# Patient Record
Sex: Male | Born: 1995 | Race: Black or African American | Hispanic: No | Marital: Single | Smoking: Never smoker
Health system: Southern US, Community
[De-identification: ages and names within clinical notes are randomized; demographics above are authoritative.]

## PROBLEM LIST (undated history)

## (undated) DIAGNOSIS — F419 Anxiety disorder, unspecified: Secondary | ICD-10-CM

---

## 2017-07-02 ENCOUNTER — Ambulatory Visit (HOSPITAL_COMMUNITY)
Admission: EM | Admit: 2017-07-02 | Discharge: 2017-07-02 | Disposition: A | Discharge status: Home / Self Care | Payer: Managed Care, Other (non HMO) | Attending: Internal Medicine | Admitting: Internal Medicine

## 2017-07-02 ENCOUNTER — Encounter (HOSPITAL_COMMUNITY): Payer: Self-pay | Admitting: Emergency Medicine

## 2017-07-02 ENCOUNTER — Other Ambulatory Visit: Payer: Self-pay

## 2017-07-02 DIAGNOSIS — B349 Viral infection, unspecified: Secondary | ICD-10-CM | POA: Diagnosis not present

## 2017-07-02 MED ORDER — FLUTICASONE PROPIONATE 50 MCG/ACT NA SUSP: 2.0000 | Freq: Every day | NASAL | 0 refills | Status: DC

## 2017-07-02 MED ORDER — IPRATROPIUM BROMIDE 0.06 % NA SOLN: 2.0000 | Freq: Four times a day (QID) | NASAL | 0 refills | Status: DC

## 2017-07-02 MED ORDER — BENZONATATE 100 MG PO CAPS: 100.0000 mg | ORAL_CAPSULE | Freq: Three times a day (TID) | ORAL | 0 refills | Status: DC

## 2017-07-02 NOTE — ED Triage Notes (Signed)
Pt c/o flu like symptmos since Friday, fever, chills, body aches, sore throat and headache since Friday.

## 2017-07-02 NOTE — ED Provider Notes (Signed)
[facility]    CSN: [id number] Arrival date & time: 07/02/17  1912     History   Chief Complaint Chief Complaint  Patient presents with  . Headache    HPI Johnny Vargas is a 22 y.o. male.   22 year old male comes in for 4-day history of URI symptoms. Dizziness, chills, body aches, sore throat, headache, nonproductive cough, rhinorrhea, nasal congestion. Subjective fever. otc cold medications without relief.  Denies syncope, confusion.  Denies sick contact.  Never smoker.      History reviewed. No pertinent past medical history.  There are no active problems to display for this patient.   History reviewed. No pertinent surgical history.     Home Medications    Prior to Admission medications   Medication Sig Start Date End Date Taking? Authorizing Provider  benzonatate (TESSALON) 100 MG capsule Take 1 capsule (100 mg total) by mouth every 8 (eight) hours. 07/02/17   Cathie HoopsYu, Rayvin Abid V, PA-C  fluticasone (FLONASE) 50 MCG/ACT nasal spray Place 2 sprays into both nostrils daily. 07/02/17   Cathie HoopsYu, Elvy Mclarty V, PA-C  ipratropium (ATROVENT) 0.06 % nasal spray Place 2 sprays into both nostrils 4 (four) times daily. 07/02/17   Belinda FisherYu, Bernadette Armijo V, PA-C    Family History No family history on file.  Social History Social History   Tobacco Use  . Smoking status: Not on file  Substance Use Topics  . Alcohol use: Not on file  . Drug use: Not on file     Allergies   Penicillins   Review of Systems Review of Systems  Reason unable to perform ROS: See HPI as above.     Physical Exam Triage Vital Signs ED Triage Vitals  Enc Vitals Group     BP 07/02/17 1928 134/79     Pulse Rate 07/02/17 1928 75     Resp 07/02/17 1928 16     Temp 07/02/17 1928 99.4 F (37.4 C)     Temp src --      SpO2 07/02/17 1928 100 %     Weight --      Height --      Head Circumference --      Peak Flow --      Pain Score 07/02/17 1929 5     Pain Loc --      Pain Edu? --      Excl. in GC? --     No data found.  Updated Vital Signs BP 134/79   Pulse 75   Temp 99.4 F (37.4 C)   Resp 16   SpO2 100%   Physical Exam  Constitutional: He is oriented to person, place, and time. He appears well-developed and well-nourished. No distress.  HENT:  Head: Normocephalic and atraumatic.  Right Ear: External ear and ear canal normal. Tympanic membrane is not erythematous and not bulging. A middle ear effusion is present.  Left Ear: External ear and ear canal normal. Tympanic membrane is not erythematous and not bulging. A middle ear effusion is present.  Nose: Mucosal edema and rhinorrhea present. Right sinus exhibits frontal sinus tenderness. Right sinus exhibits no maxillary sinus tenderness. Left sinus exhibits frontal sinus tenderness. Left sinus exhibits no maxillary sinus tenderness.  Mouth/Throat: Uvula is midline, oropharynx is clear and moist and mucous membranes are normal.  Eyes: Conjunctivae are normal. Pupils are equal, round, and reactive to light.  Neck: Normal range of motion. Neck supple.  Cardiovascular: Normal rate, regular rhythm and normal heart sounds. Exam reveals  no gallop and no friction rub.  No murmur heard. Pulmonary/Chest: Effort normal and breath sounds normal. He has no decreased breath sounds. He has no wheezes. He has no rhonchi. He has no rales.  Lymphadenopathy:    He has no cervical adenopathy.  Neurological: He is alert and oriented to person, place, and time.  Skin: Skin is warm and dry.  Psychiatric: He has a normal mood and affect. His behavior is normal. Judgment normal.     UC Treatments / Results  Labs (all labs ordered are listed, but only abnormal results are displayed) Labs Reviewed - No data to display  EKG  EKG Interpretation None       Radiology No results found.  Procedures Procedures (including critical care time)  Medications Ordered in UC Medications - No data to display   Initial Impression / Assessment and Plan /  UC Course  I have reviewed the triage vital signs and the nursing notes.  Pertinent labs & imaging results that were available during my care of the patient were reviewed by me and considered in my medical decision making (see chart for details).    Discussed with patient history and exam most consistent with viral URI.  Patient nontoxic in appearance.  Symptomatic treatment as needed. Push fluids. Return precautions given.    Final Clinical Impressions(s) / UC Diagnoses   Final diagnoses:  Viral illness    ED Discharge Orders        Ordered    fluticasone (FLONASE) 50 MCG/ACT nasal spray  Daily     07/02/17 2015    ipratropium (ATROVENT) 0.06 % nasal spray  4 times daily     07/02/17 2015    benzonatate (TESSALON) 100 MG capsule  Every 8 hours     07/02/17 2015        Belinda Fisher, PA-C 07/02/17 2024

## 2017-07-02 NOTE — Discharge Instructions (Signed)
Tessalon for cough. Start flonase, atrovent nasal spray, for nasal congestion/drainage. You can use over the counter nasal saline rinse such as neti pot for nasal congestion. Keep hydrated, your urine should be clear to pale yellow in color. Tylenol/motrin for fever and pain. Monitor for any worsening of symptoms, chest pain, shortness of breath, wheezing, swelling of the throat, follow up for reevaluation.   For sore throat try using a honey-based tea. Use 3 teaspoons of honey with juice squeezed from half lemon. Place shaved pieces of ginger into 1/2-1 cup of water and warm over stove top. Then mix the ingredients and repeat every 4 hours as needed.

## 2018-02-27 ENCOUNTER — Emergency Department (HOSPITAL_COMMUNITY): Payer: Managed Care, Other (non HMO)

## 2018-02-27 ENCOUNTER — Emergency Department (HOSPITAL_COMMUNITY)
Admission: EM | Admit: 2018-02-27 | Discharge: 2018-02-28 | Disposition: A | Discharge status: Other Acute Inpatient Hospital | Payer: Managed Care, Other (non HMO) | Attending: Emergency Medicine | Admitting: Emergency Medicine

## 2018-02-27 ENCOUNTER — Other Ambulatory Visit: Payer: Self-pay

## 2018-02-27 DIAGNOSIS — D72819 Decreased white blood cell count, unspecified: Secondary | ICD-10-CM

## 2018-02-27 DIAGNOSIS — F329 Major depressive disorder, single episode, unspecified: Secondary | ICD-10-CM | POA: Diagnosis not present

## 2018-02-27 DIAGNOSIS — R569 Unspecified convulsions: Secondary | ICD-10-CM | POA: Insufficient documentation

## 2018-02-27 DIAGNOSIS — T450X2A Poisoning by antiallergic and antiemetic drugs, intentional self-harm, initial encounter: Secondary | ICD-10-CM

## 2018-02-27 DIAGNOSIS — R45851 Suicidal ideations: Secondary | ICD-10-CM

## 2018-02-27 DIAGNOSIS — F322 Major depressive disorder, single episode, severe without psychotic features: Secondary | ICD-10-CM

## 2018-02-27 DIAGNOSIS — Z79899 Other long term (current) drug therapy: Secondary | ICD-10-CM | POA: Diagnosis not present

## 2018-02-27 NOTE — ED Provider Notes (Addendum)
[facility] EMERGENCY DEPARTMENT Provider Note   CSN: [id number] Arrival date & time: 02/27/18  2336     History   Chief Complaint Chief Complaint  Patient presents with  . Drug Overdose  . Suicide Attempt    HPI Johnny Vargas is a 22 y.o. male.  The history is provided by the EMS personnel. The history is limited by the condition of the patient (Unresponsive).  He has history of depression and is brought in by ambulance after apparent suicide attempts.  He did leave a suicide note and took a bottle of approximately 20-30 diphenhydramine 25 mg capsules as well as a bottle of melatonin.  EMS reports 2 seizures in route.  He received 4 mg of Vidaza Lamb and had significant respiratory depression requiring ventilatory support with Ambu bag.  He did have urinary incontinence during the second seizure.  In addition to the medication, he also had a belt around his neck and apparently had tried to hang himself but there was no report of any obvious neck injury by EMS.  No past medical history on file.  There are no active problems to display for this patient.   No past surgical history on file.      Home Medications    Prior to Admission medications   Medication Sig Start Date End Date Taking? Authorizing Provider  benzonatate (TESSALON) 100 MG capsule Take 1 capsule (100 mg total) by mouth every 8 (eight) hours. 07/02/17   Cathie Hoops, Amy V, PA-C  fluticasone (FLONASE) 50 MCG/ACT nasal spray Place 2 sprays into both nostrils daily. 07/02/17   Cathie Hoops, Amy V, PA-C  ipratropium (ATROVENT) 0.06 % nasal spray Place 2 sprays into both nostrils 4 (four) times daily. 07/02/17   Belinda Fisher, PA-C    Family History No family history on file.  Social History Social History   Tobacco Use  . Smoking status: Not on file  Substance Use Topics  . Alcohol use: Not on file  . Drug use: Not on file     Allergies   Penicillins   Review of Systems Review of Systems  Unable to  perform ROS: Patient unresponsive     Physical Exam Updated Vital Signs BP (!) 123/91   Pulse 72   Temp 97.6 F (36.4 C)   Resp 16   SpO2 100%   Physical Exam  Nursing note and vitals reviewed.  22 year old male, resting comfortably and in no acute distress. Vital signs are significant for borderline elevated diastolic blood pressure. Oxygen saturation is 100%, which is normal. Head is normocephalic and atraumatic. PERRLA. Oropharynx is clear. Neck shows no signs of trauma.  There is no adenopathy or JVD. Lungs are clear without rales, wheezes, or rhonchi.  There is good air movement. Chest moves symmetrically. Heart has regular rate and rhythm without murmur. Abdomen is soft, flat without masses or hepatosplenomegaly and peristalsis is normoactive. Extremities have no cyanosis or edema, full range of motion is present. Skin is warm and dry without rash. Neurologic: He responds to painful stimuli with some purposeful movement, but is nonverbal, cranial nerves are intact, there are no gross motor or sensory deficits.  ED Treatments / Results  Labs (all labs ordered are listed, but only abnormal results are displayed) Labs Reviewed  CBC WITH DIFFERENTIAL/PLATELET - Abnormal; Notable for the following components:      Result Value   WBC 2.8 (*)    Neutro Abs 1.5 (*)    All other  components within normal limits  RAPID URINE DRUG SCREEN, HOSP PERFORMED - Abnormal; Notable for the following components:   Benzodiazepines POSITIVE (*)    All other components within normal limits  ACETAMINOPHEN LEVEL - Abnormal; Notable for the following components:   Acetaminophen (Tylenol), Serum <10 (*)    All other components within normal limits  COMPREHENSIVE METABOLIC PANEL  ETHANOL  SALICYLATE LEVEL    EKG EKG Interpretation  Date/Time:  Tuesday February 27 2018 23:48:07 EDT Ventricular Rate:  77 PR Interval:    QRS Duration: 80 QT Interval:  376 QTC Calculation: 426 R  Axis:   82 Text Interpretation:  Sinus rhythm RSR' in V1 or V2, probably normal variant ST elev, probable normal early repol pattern No old tracing to compare Confirmed by Dione Booze ([id number]) on 02/27/2018 11:54:55 PM   Radiology Ct Head Wo Contrast  Result Date: 02/28/2018 CLINICAL DATA:  Seizure EXAM: CT HEAD WITHOUT CONTRAST TECHNIQUE: Contiguous axial images were obtained from the base of the skull through the vertex without intravenous contrast. COMPARISON:  None. FINDINGS: Brain: No evidence of acute infarction, hemorrhage, hydrocephalus, extra-axial collection or mass lesion/mass effect. Vascular: No hyperdense vessel or unexpected calcification. Skull: Normal. Negative for fracture or focal lesion. Sinuses/Orbits: No acute finding. Other: None IMPRESSION: Negative non contrasted CT appearance of the brain Electronically Signed   By: Jasmine Pang M.D.   On: 02/28/2018 01:02   Dg Chest Port 1 View  Result Date: 02/28/2018 CLINICAL DATA:  Altered mental status. Attempted hanging. Medication ingestion. Witnessed seizures. EXAM: PORTABLE CHEST 1 VIEW COMPARISON:  None. FINDINGS: The heart size and mediastinal contours are within normal limits. Both lungs are clear. The visualized skeletal structures are unremarkable. IMPRESSION: No active disease. Electronically Signed   By: Burman Nieves M.D.   On: 02/28/2018 00:18    Procedures Procedures  CRITICAL CARE Performed by: Dione Booze Total critical care time: 60 minutes Critical care time was exclusive of separately billable procedures and treating other patients. Critical care was necessary to treat or prevent imminent or life-threatening deterioration. Critical care was time spent personally by me on the following activities: development of treatment plan with patient and/or surrogate as well as nursing, discussions with consultants, evaluation of patient's response to treatment, examination of patient, obtaining history from patient or  surrogate, ordering and performing treatments and interventions, ordering and review of laboratory studies, ordering and review of radiographic studies, pulse oximetry and re-evaluation of patient's condition.  Medications Ordered in ED Medications  alum & mag hydroxide-simeth (MAALOX/MYLANTA) 200-200-20 MG/5ML suspension 30 mL (has no administration in time range)  ondansetron (ZOFRAN) tablet 4 mg (has no administration in time range)  acetaminophen (TYLENOL) tablet 650 mg (has no administration in time range)  fluticasone (FLONASE) 50 MCG/ACT nasal spray 2 spray (has no administration in time range)  ipratropium (ATROVENT) 0.06 % nasal spray 2 spray (has no administration in time range)     Initial Impression / Assessment and Plan / ED Course  I have reviewed the triage vital signs and the nursing notes.  Pertinent labs & imaging results that were available during my care of the patient were reviewed by me and considered in my medical decision making (see chart for details).  Seizure following overdose of diphenhydramine and melatonin with suicide intent.  No evidence of significant neck trauma from hanging attempt.  Altered mentation likely multifactorial including postictal state, administration of benzodiazepines, sedation from diphenhydramine.  Screening labs are obtained.  Because of seizure, will send  for CT scan of head.  Poison control is consulted and recommends supportive measures.  ECG shows no tachycardia or QRS widening.  Old records were reviewed, and he had been seen at another emergency department in 2015 for depression, but was not admitted.  1:02 AM Patient is now awake and alert.  He admits to long-standing depression and long-standing suicidal thoughts but this is his first suicide attempt.  He denies crying spells or anhedonia but has had early morning wakening.  He denies hallucinations.  He denies drug use other than marijuana.  Parents are here and confirm that he is  back to his baseline.  They do state that he had been on 2 different antidepressants when he was in middle school, but stopped taking them because of there being ineffective, and also for side effects.  3:49 AM He has been hemodynamically stable, continues to have narrow QRS complex on cardiac monitoring.  He is felt to be medically cleared for psychiatric evaluation at this point.  Screening labs were unremarkable except for mild leukopenia of uncertain cause.  Ethanol level is nondetectable as are acetaminophen and salicylate levels.  Drug screen positive for benzodiazepines which probably is the midazolam which she received in the ambulance coming to the hospital.  CT of head was unremarkable as was chest x-ray.  At this point, no indication for anticonvulsant therapy.  Final Clinical Impressions(s) / ED Diagnoses   Final diagnoses:  Intentional diphenhydramine overdose, initial encounter [facility])  Suicidal ideation  Current severe episode of major depressive disorder without psychotic features without prior episode (HCC)  Leukopenia, unspecified type    ED Discharge Orders    None       Dione Booze, MD 02/28/18 [id number]    Dione Booze, MD 02/28/18 [phone]

## 2018-02-27 NOTE — ED Triage Notes (Signed)
Pt found at home by parents with belt around his neck. No ligature marks noted at present. Pt was able to tell EMS that he took a whole bottle of benadryl and melatonin prior to attempting to hang himself. EMS witnessed two seizures enroute, given 2mg  IV versed, resp rate decreased, assisted ventilations on arrival.

## 2018-02-28 ENCOUNTER — Encounter (HOSPITAL_COMMUNITY): Payer: Self-pay | Admitting: *Deleted

## 2018-02-28 ENCOUNTER — Other Ambulatory Visit: Payer: Self-pay

## 2018-02-28 ENCOUNTER — Emergency Department (HOSPITAL_COMMUNITY): Payer: Managed Care, Other (non HMO)

## 2018-02-28 ENCOUNTER — Inpatient Hospital Stay (HOSPITAL_COMMUNITY)
Admission: AD | Admit: 2018-02-28 | Discharge: 2018-03-05 | DRG: 885 | Disposition: A | Discharge status: Home / Self Care | Payer: 59 | Source: Intra-hospital | Attending: Psychiatry | Admitting: Psychiatry

## 2018-02-28 DIAGNOSIS — T50992A Poisoning by other drugs, medicaments and biological substances, intentional self-harm, initial encounter: Secondary | ICD-10-CM | POA: Diagnosis not present

## 2018-02-28 DIAGNOSIS — G4700 Insomnia, unspecified: Secondary | ICD-10-CM | POA: Diagnosis present

## 2018-02-28 DIAGNOSIS — F17210 Nicotine dependence, cigarettes, uncomplicated: Secondary | ICD-10-CM | POA: Diagnosis not present

## 2018-02-28 DIAGNOSIS — T450X2D Poisoning by antiallergic and antiemetic drugs, intentional self-harm, subsequent encounter: Secondary | ICD-10-CM | POA: Diagnosis not present

## 2018-02-28 DIAGNOSIS — F419 Anxiety disorder, unspecified: Secondary | ICD-10-CM | POA: Diagnosis present

## 2018-02-28 DIAGNOSIS — Z818 Family history of other mental and behavioral disorders: Secondary | ICD-10-CM

## 2018-02-28 DIAGNOSIS — Z880 Allergy status to penicillin: Secondary | ICD-10-CM | POA: Diagnosis not present

## 2018-02-28 DIAGNOSIS — F332 Major depressive disorder, recurrent severe without psychotic features: Secondary | ICD-10-CM | POA: Diagnosis not present

## 2018-02-28 DIAGNOSIS — T1491XA Suicide attempt, initial encounter: Secondary | ICD-10-CM | POA: Diagnosis not present

## 2018-02-28 DIAGNOSIS — T1491XD Suicide attempt, subsequent encounter: Secondary | ICD-10-CM | POA: Diagnosis not present

## 2018-02-28 DIAGNOSIS — F17290 Nicotine dependence, other tobacco product, uncomplicated: Secondary | ICD-10-CM | POA: Diagnosis present

## 2018-02-28 DIAGNOSIS — F1290 Cannabis use, unspecified, uncomplicated: Secondary | ICD-10-CM | POA: Diagnosis present

## 2018-02-28 DIAGNOSIS — F322 Major depressive disorder, single episode, severe without psychotic features: Secondary | ICD-10-CM | POA: Diagnosis present

## 2018-02-28 DIAGNOSIS — T450X2A Poisoning by antiallergic and antiemetic drugs, intentional self-harm, initial encounter: Secondary | ICD-10-CM | POA: Diagnosis not present

## 2018-02-28 DIAGNOSIS — Z915 Personal history of self-harm: Secondary | ICD-10-CM | POA: Diagnosis not present

## 2018-02-28 DIAGNOSIS — F329 Major depressive disorder, single episode, unspecified: Secondary | ICD-10-CM | POA: Diagnosis not present

## 2018-02-28 HISTORY — DX: Anxiety disorder, unspecified: F41.9

## 2018-02-28 LAB — COMPREHENSIVE METABOLIC PANEL
ALT: 23 U/L (ref 0–44)
AST: 24 U/L (ref 15–41)
Albumin: 4.2 g/dL (ref 3.5–5.0)
Alkaline Phosphatase: 61 U/L (ref 38–126)
Anion gap: 8 (ref 5–15)
BILIRUBIN TOTAL: 0.4 mg/dL (ref 0.3–1.2)
BUN: 13 mg/dL (ref 6–20)
CO2: 22 mmol/L (ref 22–32)
Calcium: 9.6 mg/dL (ref 8.9–10.3)
Chloride: 109 mmol/L (ref 98–111)
Creatinine, Ser: 1.13 mg/dL (ref 0.61–1.24)
GFR calc Af Amer: >60 mL/min (ref >60)
Glucose, Bld: 86 mg/dL (ref 70–99)
POTASSIUM: 4.2 mmol/L (ref 3.5–5.1)
Sodium: 139 mmol/L (ref 135–145)
TOTAL PROTEIN: 7.1 g/dL (ref 6.5–8.1)

## 2018-02-28 LAB — CBC WITH DIFFERENTIAL/PLATELET
Abs Immature Granulocytes: 0.01 10*3/uL (ref 0.00–0.07)
BASOS PCT: 1 %
Basophils Absolute: 0 10*3/uL (ref 0.0–0.1)
EOS ABS: 0 10*3/uL (ref 0.0–0.5)
EOS PCT: 1 %
HCT: 50.3 % (ref 39.0–52.0)
Hemoglobin: 15.7 g/dL (ref 13.0–17.0)
Immature Granulocytes: 0 %
Lymphocytes Relative: 36 %
Lymphs Abs: 1 10*3/uL (ref 0.7–4.0)
MCH: 27.6 pg (ref 26.0–34.0)
MCHC: 31.2 g/dL (ref 30.0–36.0)
MCV: 88.4 fL (ref 80.0–100.0)
MONO ABS: 0.3 10*3/uL (ref 0.1–1.0)
MONOS PCT: 10 %
NEUTROS PCT: 52 %
Neutro Abs: 1.5 10*3/uL — ABNORMAL LOW (ref 1.7–7.7)
PLATELETS: 167 10*3/uL (ref 150–400)
RBC: 5.69 MIL/uL (ref 4.22–5.81)
RDW: 14.9 % (ref 11.5–15.5)
WBC: 2.8 10*3/uL — ABNORMAL LOW (ref 4.0–10.5)
nRBC: 0 % (ref 0.0–0.2)

## 2018-02-28 LAB — RAPID URINE DRUG SCREEN, HOSP PERFORMED
Amphetamines: NOT DETECTED
Barbiturates: NOT DETECTED
Benzodiazepines: POSITIVE — AB
Cocaine: NOT DETECTED
Opiates: NOT DETECTED
Tetrahydrocannabinol: NOT DETECTED

## 2018-02-28 LAB — SALICYLATE LEVEL

## 2018-02-28 LAB — ACETAMINOPHEN LEVEL

## 2018-02-28 LAB — ETHANOL

## 2018-02-28 MED ORDER — ALUM & MAG HYDROXIDE-SIMETH 200-200-20 MG/5ML PO SUSP: 30.0000 mL | Freq: Four times a day (QID) | ORAL | Status: DC | PRN

## 2018-02-28 MED ORDER — ACETAMINOPHEN 325 MG PO TABS: 650.0000 mg | ORAL_TABLET | ORAL | Status: DC | PRN

## 2018-02-28 MED ORDER — FLUTICASONE PROPIONATE 50 MCG/ACT NA SUSP
2.0000 | Freq: Every day | NASAL | Status: DC
  Administered 2018-02-28: 2 via NASAL
  Filled 2018-02-28: qty 16

## 2018-02-28 MED ORDER — HYDROXYZINE HCL 25 MG PO TABS
25.0000 mg | ORAL_TABLET | Freq: Three times a day (TID) | ORAL | Status: DC | PRN
  Administered 2018-03-01 – 2018-03-04 (×3): 25 mg via ORAL
  Filled 2018-02-28 (×2): qty 1

## 2018-02-28 MED ORDER — TRAZODONE HCL 50 MG PO TABS
50.0000 mg | ORAL_TABLET | Freq: Every evening | ORAL | Status: DC | PRN
  Administered 2018-02-28 – 2018-03-02 (×5): 50 mg via ORAL
  Filled 2018-02-28 (×10): qty 1

## 2018-02-28 MED ORDER — ONDANSETRON HCL 4 MG PO TABS: 4.0000 mg | ORAL_TABLET | Freq: Three times a day (TID) | ORAL | Status: DC | PRN

## 2018-02-28 MED ORDER — IPRATROPIUM BROMIDE 0.06 % NA SOLN
2.0000 | Freq: Four times a day (QID) | NASAL | Status: DC
  Filled 2018-02-28 (×2): qty 15

## 2018-02-28 MED ORDER — IPRATROPIUM BROMIDE 0.06 % NA SOLN
2.0000 | Freq: Four times a day (QID) | NASAL | Status: DC
  Administered 2018-02-28: 2 via NASAL
  Filled 2018-02-28: qty 15

## 2018-02-28 MED ORDER — FLUTICASONE PROPIONATE 50 MCG/ACT NA SUSP
2.0000 | Freq: Every day | NASAL | Status: DC
  Filled 2018-02-28: qty 16

## 2018-02-28 NOTE — ED Notes (Signed)
Called [facility] for transfer to [facility]

## 2018-02-28 NOTE — Progress Notes (Signed)
Johnny Vargas is a 22 year old male pt admitted on voluntary basis. On admission, he reports that he has been feeling depressed and suicidal and does report that he took overdose in suicide attempt. He reports school and money stress and reports that he is not doing weill in school. He reports that he has had depression since the 3rd grade but has never sought any help previously. He reports that he is not on any medications. He reports occasional marijuana usage but denies any other substance abuse issues. He reports that he lives at home with mom and step-dad and reports that he will return there once he is discharged. Khaleem was escorted to the unit, oriented to the milieu and safety maintained.

## 2018-02-28 NOTE — ED Notes (Signed)
[name], NP, patient meets inpatient criteria. TTS to secure placement. Elliot Gurney, Charity fundraiser, informed of disposition.

## 2018-02-28 NOTE — ED Notes (Signed)
PT given lunch bag and 8oz drink.

## 2018-02-28 NOTE — Tx Team (Signed)
Initial Treatment Plan 02/28/2018 3:55 PM [name] RUE:[id number]    PATIENT STRESSORS: Educational concerns Financial difficulties Occupational concerns   PATIENT STRENGTHS: Ability for insight Average or above average intelligence General fund of knowledge Motivation for treatment/growth   PATIENT IDENTIFIED PROBLEMS: Depression Suicidal thoughts "I don't know, maybe medications if it will help"                     DISCHARGE CRITERIA:  Ability to meet basic life and health needs Improved stabilization in mood, thinking, and/or behavior Reduction of life-threatening or endangering symptoms to within safe limits Verbal commitment to aftercare and medication compliance  PRELIMINARY DISCHARGE PLAN: Attend aftercare/continuing care group Return to previous living arrangement  PATIENT/FAMILY INVOLVEMENT: This treatment plan has been presented to and reviewed with the patient, Johnny Vargas, and/or family member, .  The patient and family have been given the opportunity to ask questions and make suggestions.  Johnny Vargas, [place], [place] 02/28/2018, 3:55 PM

## 2018-02-28 NOTE — BH Assessment (Addendum)
Tele Assessment Note   Patient Name: Johnny Vargas MRN: [id number] Referring Physician: Dr. Preston Fleeting Location of Patient: [facility] Bed: B15C Location of Provider: Behavioral Health TTS Department  Johnny Vargas is an 22 y.o. male with SI with attempted overdose and hanging. Patient was found by his parents and called EMS. Patient reported to EMS that he took a whole bottle of benadryl and melatonin prior to attempting to hang himself, approximately 20-30 diphenhydramine 25 mg capsules as well as a bottle of melatonin. Per ED Triage Note, EMS witnessed two seizures enroute, given 2mg  IV versed, resp rate decreased, assisted ventilations on arrival. Patient has history of depression. Patient did leave suicide note. Patient reported having SI since the 3rd grade, coping with thoughts by ignoring them. Patient reported SI worsened in this past year. Patient reported stressors, stating, "I can't handle it anymore, school, job, money, just overwhelming stuff". Patient reported being under pressure. Patient admits to smoking marijuana once only on weekends. Patient denied history of psychiatric treatment or outpatient therapy, stating one time he came to hospital but didn't stay which was more than a couple of years ago. Patient denied being prescribed any medications. Patient denied HI and psychosis. Patient reported that both sleep and appetite are poor.   Patient is currently a Consulting civil engineer at [employer] When asked how are you doing in school, patient reported, "not the best" and chose not to elaborate. Patient resides with mother and stepdad.   Patient was calm and cooperative during assessment. Patient was oriented x4 and coherent. Patient mood was depressed, despair, helpless and sad. Patient affect was depressed and sad. Patient was drowsy. Patient speech was logical however slow. Patient agreed he needed help and is willing to sign himself in for treatment.   UDS +benzodiazepines ETOH negative  Diagnosis: Major  depressive disorder  Past Medical History: No past medical history on file.  No past surgical history on file.  Family History: No family history on file.  Social History:  has no tobacco, alcohol, and drug history on file.  Additional Social History:  Alcohol / Drug Use Pain Medications: see MAR Prescriptions: see MAR Over the Counter: see MAR  CIWA: CIWA-Ar BP: 96/63 Pulse Rate: 61 COWS:    Allergies:  Allergies  Allergen Reactions  . Penicillins     Home Medications:  (Not in a hospital admission)  OB/GYN Status:  No LMP for male patient.  General Assessment Data Location of Assessment: [facility] ED TTS Assessment: In system Is this a Tele or Face-to-Face Assessment?: Tele Assessment Is this an Initial Assessment or a Re-assessment for this encounter?: Initial Assessment Patient Accompanied by:: (alone) Language Other than English: No Living Arrangements: (family home) What gender do you identify as?: Male Marital status: Single Living Arrangements: Parent Can pt return to current living arrangement?: Yes Admission Status: Voluntary Is patient capable of signing voluntary admission?: Yes Referral Source: Self/Family/Friend     Crisis Care Plan Living Arrangements: Parent Legal Guardian: (self) Name of Psychiatrist: (none) Name of Therapist: (none)  Education Status Is patient currently in school?: Yes Current Grade: (college) Name of school: ([employer])  Risk to self with the past 6 months Suicidal Ideation: Yes-Currently Present Has patient been a risk to self within the past 6 months prior to admission? : Yes Suicidal Intent: Yes-Currently Present Has patient had any suicidal intent within the past 6 months prior to admission? : Yes Is patient at risk for suicide?: Yes Suicidal Plan?: Yes-Currently Present Has patient had any suicidal plan within  the past 6 months prior to admission? : Yes Specify Current Suicidal Plan: (attempted overdose and  hanging) Access to Means: Yes Specify Access to Suicidal Means: (pills in home and belt in the house) What has been your use of drugs/alcohol within the last 12 months?: (marijuana once weekly) Previous Attempts/Gestures: No How many times?: (0) Triggers for Past Attempts: ("school, job, money, overwhelming Production designer, theatre/television/film Self Injurious Behavior: None Family Suicide History: No Recent stressful life event(s): Financial Problems("school, job, money and overwhelming stuff") Persecutory voices/beliefs?: No Depression: Yes Depression Symptoms: Insomnia, Isolating, Loss of interest in usual pleasures, Fatigue Substance abuse history and/or treatment for substance abuse?: No  Risk to Others within the past 6 months Homicidal Ideation: No Does patient have any lifetime risk of violence toward others beyond the six months prior to admission? : No Thoughts of Harm to Others: No Current Homicidal Intent: No Current Homicidal Plan: No Access to Homicidal Means: No History of harm to others?: No Assessment of Violence: None Noted Does patient have access to weapons?: No Criminal Charges Pending?: No Does patient have a court date: No Is patient on probation?: No  Psychosis Hallucinations: None noted Delusions: None noted  Mental Status Report Appearance/Hygiene: Unremarkable Eye Contact: Fair Motor Activity: Unremarkable Speech: Logical/coherent, Slow Level of Consciousness: Alert, Drowsy Mood: Depressed, Despair, Helpless, Sad Affect: Depressed, Sad Anxiety Level: Minimal Thought Processes: Coherent Judgement: Impaired Orientation: Place, Person, Situation, Time Obsessive Compulsive Thoughts/Behaviors: None  Cognitive Functioning Concentration: Fair Memory: Recent Intact Is patient IDD: No Insight: Fair Impulse Control: Poor Appetite: Poor Have you had any weight changes? : No Change Sleep: Decreased Total Hours of Sleep: (not sure) Vegetative Symptoms:  None  ADLScreening [facility] Assessment Services) Patient's cognitive ability adequate to safely complete daily activities?: Yes Patient able to express need for assistance with ADLs?: Yes Independently performs ADLs?: Yes (appropriate for developmental age)  Prior Inpatient Therapy Prior Inpatient Therapy: No  Prior Outpatient Therapy Prior Outpatient Therapy: No Does patient have an ACCT team?: No Does patient have Intensive In-House Services?  : No Does patient have [facility] services? : No Does patient have P4CC services?: No  ADL Screening (condition at time of admission) Patient's cognitive ability adequate to safely complete daily activities?: Yes Patient able to express need for assistance with ADLs?: Yes Independently performs ADLs?: Yes (appropriate for developmental age)                        Disposition:  Disposition Initial Assessment Completed for this Encounter: Yes  [name], NP, patient meets inpatient criteria. TTS to secure placement. Doctor and RN notified of disposition.  This service was provided via telemedicine using a 2-way, interactive audio and video technology.  Names of all persons participating in this telemedicine service and their role in this encounter. Name: Johnny Vargas Role: patient  Name: Al [facility], [facility] Role: TTS Clinician  Name:  Role:   Name:  Role:     Burnetta Sabin 02/28/2018 4:48 AM

## 2018-02-28 NOTE — ED Notes (Signed)
Pt given phone to make a call 

## 2018-02-28 NOTE — ED Notes (Signed)
Poison control contacted. 0441am case was closed by poison control after speaking with Elliot Gurney, Charity fundraiser.

## 2018-02-28 NOTE — ED Notes (Signed)
Belongings given to family.

## 2018-02-28 NOTE — Progress Notes (Signed)
Pt accepted to [facility], Bed 307-2 [name], NP, is the accepting provider.  Dr. Elsie Saas, MD, is the attending provider.  Call report to [phone]  [name], RN @ [facility] Psych ED notified.   Pt is Voluntary.  Pt may be transported by [facility]  Pt scheduled  to arrive at [facility] AFTER POISON CONTROL SIGNS OFF AND NOTE IS ENTERED.  Johnny Vargas. [name], MSW, LCSWA Disposition Clinical Social Work [phone] (cell) [phone] (office)

## 2018-02-28 NOTE — ED Notes (Signed)
TTS being done at this time.  

## 2018-02-28 NOTE — Progress Notes (Signed)
Patient did not attend the evening speaker NA meeting. Pt was notified that group was beginning but remained in bed.   

## 2018-03-01 DIAGNOSIS — T50992A Poisoning by other drugs, medicaments and biological substances, intentional self-harm, initial encounter: Secondary | ICD-10-CM

## 2018-03-01 DIAGNOSIS — T1491XA Suicide attempt, initial encounter: Secondary | ICD-10-CM

## 2018-03-01 DIAGNOSIS — T450X2A Poisoning by antiallergic and antiemetic drugs, intentional self-harm, initial encounter: Secondary | ICD-10-CM

## 2018-03-01 DIAGNOSIS — F332 Major depressive disorder, recurrent severe without psychotic features: Principal | ICD-10-CM

## 2018-03-01 MED ORDER — ESCITALOPRAM OXALATE 10 MG PO TABS
10.0000 mg | ORAL_TABLET | Freq: Every day | ORAL | Status: DC
  Administered 2018-03-01 – 2018-03-03 (×3): 10 mg via ORAL
  Filled 2018-03-01 (×7): qty 1

## 2018-03-01 NOTE — H&P (Addendum)
Psychiatric Admission Assessment Adult  Patient Identification: Johnny Vargas  MRN:  [id number]  Date of Evaluation:  03/01/2018  Chief Complaint: Worsening symptoms of depression triggering suicide attempt by overdose & hanging.  Principal Diagnosis: MDD (major depressive disorder), severe ([place])  Diagnosis:   Patient Active Problem List   Diagnosis Date Noted  . MDD (major depressive disorder), severe ([place]) [F32.2] 02/28/2018    Priority: High   History of Present Illness: This is an admission assessment for this 22 year old African-American male. Patient is admitted to the [facility] from the [facility] ED with complaints of worsening symptoms of depression & suicide attempt by overdose on Benadryl & Melatonin. Chart review indicates patient citing school related stress & financial problems as the trigger. He was transferred to the [facility] for more psychiatric evaluation & treatment.  During this assessment, Johnny Vargas reports, "I remembered waking up at the hospital on the 15th of this month, but I do not remember how I got there or anything else. When I woke up, I saw a cop asking me questions about Marijuana. I still remember bits & pieces of what was happening to me & how I was feeling prior to my transfer to the ED. I got wrapped up with some bad group who were into drugs & other bad things. I was trying to dissociate myself from this bad group & yet feeling alone because I do not have other friends. The lonely life kept on building up. It got to a point whereby I could no longer cope with it. I felt like no one cares about me or needs me.  I decided to take my own life. I took a bottle of Benadryl & a bottle of water. I keeping taking those pills. I also took some Melatonin to enhance the effects of the Benadryl. I then took my belt & hung myself by my closet.  After I put the belt around my neck, I lowered myself on the floor. I felt myself shutting down, then everything became dark. This is my  first attempt & it was unplanned, rather impulsive. I know I have been depressed since 3rd grade. My father died of cancer when I was 4. I have a step-father now. I have problem sleeping at night. I have never been treated for depression".  Associated Signs/Symptoms:  Depression Symptoms:  depressed mood, insomnia, feelings of worthlessness/guilt, hopelessness, anxiety,  (Hypo) Manic Symptoms:  Impulsivity,  Anxiety Symptoms:  Excessive Worry,  Psychotic Symptoms:  Denies any hallucinations, delusions or paranoia.  PTSD Symptoms: Denies any PTSD symptoms or events.  Total Time spent with patient: 1 hour  Past Psychiatric History: Depression  Is the patient at risk to self? No.  Has the patient been a risk to self in the past 6 months? Yes.    Has the patient been a risk to self within the distant past? No.  Is the patient a risk to others? No.  Has the patient been a risk to others in the past 6 months? No.  Has the patient been a risk to others within the distant past? No.   Prior Inpatient Therapy: Denies Prior Outpatient Therapy: Denies  Alcohol Screening: 1. How often do you have a drink containing alcohol?: 2 to 4 times a month 2. How many drinks containing alcohol do you have on a typical day when you are drinking?: 1 or 2 3. How often do you have six or more drinks on one occasion?: Never AUDIT-C Score: 2  4. How often during the last year have you found that you were not able to stop drinking once you had started?: Never 5. How often during the last year have you failed to do what was normally expected from you becasue of drinking?: Never 6. How often during the last year have you needed a first drink in the morning to get yourself going after a heavy drinking session?: Never 7. How often during the last year have you had a feeling of guilt of remorse after drinking?: Never 8. How often during the last year have you been unable to remember what happened the night  before because you had been drinking?: Never 9. Have you or someone else been injured as a result of your drinking?: No 10. Has a relative or friend or a doctor or another health worker been concerned about your drinking or suggested you cut down?: No Alcohol Use Disorder Identification Test Final Score (AUDIT): 2 Intervention/Follow-up: AUDIT Score <7 follow-up not indicated  Substance Abuse History in the last 12 months:  Yes.    Consequences of Substance Abuse: Medical Consequences:  Liver damage, Possible death by overdose Legal Consequences:  Arrests, jail time, Loss of driving privilege. Family Consequences:  Family discord, divorce and or separation.  Previous Psychotropic Medications: No   Psychological Evaluations: No   Past Medical History:  Past Medical History:  Diagnosis Date  . Anxiety    History reviewed. No pertinent surgical history. Family History: History reviewed. No pertinent family history.  Family Psychiatric  History: Major depression: Mother.  Tobacco Screening: Have you used any form of tobacco in the last 30 days? (Cigarettes, Smokeless Tobacco, Cigars, and/or Pipes): Yes Tobacco use, Select all that apply: 4 or less cigarettes per day Are you interested in Tobacco Cessation Medications?: No, patient refused Counseled patient on smoking cessation including recognizing danger situations, developing coping skills and basic information about quitting provided: Refused/Declined practical counseling  Social History: Single, no children, unemployed, Electronics engineer.  Social History   Substance and Sexual Activity  Alcohol Use Yes     Social History   Substance and Sexual Activity  Drug Use Yes  . Types: Marijuana    Additional Social History:  Allergies:   Allergies  Allergen Reactions  . Penicillins    Lab Results:  Results for orders placed or performed during the hospital encounter of 02/27/18 (from the past 48 hour(s))  Comprehensive  metabolic panel     Status: None   Collection Time: 02/27/18 11:58 PM  Result Value Ref Range   Sodium 139 135 - 145 mmol/L   Potassium 4.2 3.5 - 5.1 mmol/L   Chloride 109 98 - 111 mmol/L   CO2 22 22 - 32 mmol/L   Glucose, Bld 86 70 - 99 mg/dL   BUN 13 6 - 20 mg/dL   Creatinine, Ser 1.13 0.61 - 1.24 mg/dL   Calcium 9.6 8.9 - 10.3 mg/dL   Total Protein 7.1 6.5 - 8.1 g/dL   Albumin 4.2 3.5 - 5.0 g/dL   AST 24 15 - 41 U/L   ALT 23 0 - 44 U/L   Alkaline Phosphatase 61 38 - 126 U/L   Total Bilirubin 0.4 0.3 - 1.2 mg/dL   GFR calc non Af Amer >60 >60 mL/min   GFR calc Af Amer >60 >60 mL/min    Comment: (NOTE) The eGFR has been calculated using the CKD EPI equation. This calculation has not been validated in all clinical situations. eGFR's persistently <60  mL/min signify possible Chronic Kidney Disease.    Anion gap 8 5 - 15    Comment: Performed at [facility] [address] [place], [place] [address]  Ethanol     Status: None   Collection Time: 02/27/18 11:58 PM  Result Value Ref Range   Alcohol, Ethyl (B) <10 <10 mg/dL    Comment: (NOTE) Lowest detectable limit for serum alcohol is 10 mg/dL. For medical purposes only. Performed at [facility], [place] [address] [place], [place] [address]   CBC with Differential     Status: Abnormal   Collection Time: 02/27/18 11:58 PM  Result Value Ref Range   WBC 2.8 (L) 4.0 - 10.5 K/uL   RBC 5.69 4.22 - 5.81 MIL/uL   Hemoglobin 15.7 13.0 - 17.0 g/dL   HCT 50.3 39.0 - 52.0 %   MCV 88.4 80.0 - 100.0 fL   MCH 27.6 26.0 - 34.0 pg   MCHC 31.2 30.0 - 36.0 g/dL   RDW 14.9 11.5 - 15.5 %   Platelets 167 150 - 400 K/uL   nRBC 0.0 0.0 - 0.2 %   Neutrophils Relative % 52 %   Neutro Abs 1.5 (L) 1.7 - 7.7 K/uL   Lymphocytes Relative 36 %   Lymphs Abs 1.0 0.7 - 4.0 K/uL   Monocytes Relative 10 %   Monocytes Absolute 0.3 0.1 - 1.0 K/uL   Eosinophils Relative 1 %   Eosinophils Absolute 0.0 0.0 - 0.5 K/uL   Basophils Relative 1 %    Basophils Absolute 0.0 0.0 - 0.1 K/uL   Immature Granulocytes 0 %   Abs Immature Granulocytes 0.01 0.00 - 0.07 K/uL    Comment: Performed at [facility] [address], [place], [place] [address]  Salicylate level     Status: None   Collection Time: 02/27/18 11:58 PM  Result Value Ref Range   Salicylate Lvl <2.1 2.8 - 30.0 mg/dL    Comment: Performed at [facility] [address], [place], [place] [address]  Acetaminophen level     Status: Abnormal   Collection Time: 02/27/18 11:58 PM  Result Value Ref Range   Acetaminophen (Tylenol), Serum <10 (L) 10 - 30 ug/mL    Comment: (NOTE) Therapeutic concentrations vary significantly. A range of 10-30 ug/mL  may be an effective concentration for many patients. However, some  are best treated at concentrations outside of this range. Acetaminophen concentrations >150 ug/mL at 4 hours after ingestion  and >50 ug/mL at 12 hours after ingestion are often associated with  toxic reactions. Performed at [facility], [place] [address], [place],  [address]   Urine rapid drug screen (hosp performed)     Status: Abnormal   Collection Time: 02/28/18  1:56 AM  Result Value Ref Range   Opiates NONE DETECTED NONE DETECTED   Cocaine NONE DETECTED NONE DETECTED   Benzodiazepines POSITIVE (A) NONE DETECTED   Amphetamines NONE DETECTED NONE DETECTED   Tetrahydrocannabinol NONE DETECTED NONE DETECTED   Barbiturates NONE DETECTED NONE DETECTED    Comment: (NOTE) DRUG SCREEN FOR MEDICAL PURPOSES ONLY.  IF CONFIRMATION IS NEEDED FOR ANY PURPOSE, NOTIFY LAB WITHIN 5 DAYS. LOWEST DETECTABLE LIMITS FOR URINE DRUG SCREEN Drug Class                     Cutoff (ng/mL) Amphetamine and metabolites    1000 Barbiturate and metabolites    200 Benzodiazepine  154 Tricyclics and metabolites     300 Opiates and metabolites        300 Cocaine and metabolites        300 THC                            50 Performed at [facility], [place] [address], [place], [place] [address]    Blood Alcohol level:  Lab Results  Component Value Date   ETH <10 61/95/0932   Metabolic Disorder Labs:  No results found for: HGBA1C, MPG No results found for: PROLACTIN No results found for: CHOL, TRIG, HDL, CHOLHDL, VLDL, LDLCALC  Current Medications: Current Facility-Administered Medications  Medication Dose Route Frequency Provider Last Rate Last Dose  . acetaminophen (TYLENOL) tablet 650 mg  650 mg Oral Q4H PRN Rankin, Shuvon B, NP      . fluticasone (FLONASE) 50 MCG/ACT nasal spray 2 spray  2 spray Each Nare Daily Rankin, Shuvon B, NP      . hydrOXYzine (ATARAX/VISTARIL) tablet 25 mg  25 mg Oral TID PRN Lindon Romp A, NP      . ipratropium (ATROVENT) 0.06 % nasal spray 2 spray  2 spray Each Nare QID Rankin, Shuvon B, NP      . traZODone (DESYREL) tablet 50 mg  50 mg Oral QHS,MR X 1 Lindon Romp A, NP   50 mg at 02/28/18 2353   PTA Medications: Medications Prior to Admission  Medication Sig Dispense Refill Last Dose  . benzonatate (TESSALON) 100 MG capsule Take 1 capsule (100 mg total) by mouth every 8 (eight) hours. (Patient not taking: Reported on 02/28/2018) 21 capsule 0 Not Taking at Unknown time  . fluticasone (FLONASE) 50 MCG/ACT nasal spray Place 2 sprays into both nostrils daily. (Patient not taking: Reported on 02/28/2018) 1 g 0 Not Taking at Unknown time  . ipratropium (ATROVENT) 0.06 % nasal spray Place 2 sprays into both nostrils 4 (four) times daily. (Patient not taking: Reported on 02/28/2018) 15 mL 0 Not Taking at Unknown time   Musculoskeletal: Strength & Muscle Tone: within normal limits Gait & Station: normal Patient leans: N/A  Psychiatric Specialty Exam: Physical Exam  Constitutional: He is oriented to person, place, and time. He appears well-developed.  HENT:  Head: Normocephalic.  Eyes: Pupils are equal, round, and reactive to light.  Neck: Normal range of motion.  Cardiovascular: Normal  rate.  Respiratory: Effort normal.  GI: Soft.  Genitourinary:  Genitourinary Comments: Deferred  Musculoskeletal: Normal range of motion.  Neurological: He is alert and oriented to person, place, and time.  Skin: Skin is warm and dry.    Review of Systems  Constitutional: Negative.   HENT: Negative.   Eyes: Negative.   Respiratory: Negative.  Negative for cough and shortness of breath.   Cardiovascular: Negative.  Negative for chest pain and palpitations.  Gastrointestinal: Negative.   Genitourinary: Negative.   Musculoskeletal: Negative.   Skin: Negative.   Neurological: Negative.   Endo/Heme/Allergies: Negative.   Psychiatric/Behavioral: Positive for depression and substance abuse (UDS (+) for Benzodiazepine). Negative for hallucinations and memory loss. The patient is nervous/anxious and has insomnia.     Blood pressure 115/88, pulse 85, temperature 100.1 F (37.8 C), temperature source Oral, resp. rate 16, height '5\' 10"'$  (1.778 m), weight 65.3 kg.Body mass index is 20.66 kg/m.  General Appearance: Casual, in a hospital scrub.  Eye Contact:  Good  Speech:  Clear and Coherent  and Normal Rate  Volume:  Normal  Mood:  Anxious, Depressed and Hopeless  Affect:  Depressed and Flat  Thought Process:  Coherent, Goal Directed and Descriptions of Associations: Intact  Orientation:  Full (Time, Place, and Person)  Thought Content:  Logical, denies any hallucinations, delusions & paranoia.  Suicidal Thoughts:  Currently denies any thoughts, plans or intent. Able to contract for safety.  Homicidal Thoughts:  Denies  Memory:  Immediate;   Good Recent;   Good Remote;   Good  Judgement:  Other:  Fairly intact  Insight:  Present  Psychomotor Activity:  Normal  Concentration:  Concentration: Good and Attention Span: Good  Recall:  Good  Fund of Knowledge:  Fair  Language:  Good  Akathisia:  No  Handed:  Right  AIMS (if indicated):     Assets:  Communication Skills Desire for  Improvement Physical Health Social Support  ADL's:  Intact  Cognition:  WNL  Sleep:  Number of Hours: 3.75   Treatment Plan Summary: Treatment Plan/Recommendations: 1. Admit for crisis management and stabilization, estimated length of stay 3-5 days.   2. Medication management to reduce current symptoms to base line and improve the patient's overall level of functioning: See MAR, Md's SRA & treatment plan.   Observation Level/Precautions:  15 minute checks  Laboratory:  Per ED, UDS (+) for Benzodiazepine  Psychotherapy: Group sessions   Medications: See Mar   Consultations: As needed    Discharge Concerns: Safety, mood stability.    Estimated LOS: 2-4 days  Other: Admit to the 300-hall.    Physician Treatment Plan for Primary Diagnosis: MDD (major depressive disorder), severe ([place])  Long Term Goal(s): Improvement in symptoms so as ready for discharge  Short Term Goals: Ability to identify changes in lifestyle to reduce recurrence of condition will improve, Ability to verbalize feelings will improve and Ability to disclose and discuss suicidal ideas  Physician Treatment Plan for Secondary Diagnosis: Principal Problem:   MDD (major depressive disorder), severe ([place])  Long Term Goal(s): Improvement in symptoms so as ready for discharge  Short Term Goals: Ability to identify and develop effective coping behaviors will improve, Compliance with prescribed medications will improve and Ability to identify triggers associated with substance abuse/mental health issues will improve  I certify that inpatient services furnished can reasonably be expected to improve the patient's condition.    Lindell Spar, NP, PMHNP, FNP-BC. 10/17/201912:39 PM    I have reviewed NP's Note, assessement, diagnosis and plan, and agree. I have also met with patient and completed suicide risk assessment.  Rosalio Catterton is a 22 y/o M without formal psychiatric history who was admitted voluntarily after a a  suicide attempt on benadryl and melatonin. He was medically cleared and then transferred to [facility] for additional treatment and stabilization.  Upon initial interview, pt shares, "I tried to kill myself, but it didn't work." Pt describes worsening depression associated with feeling overwhelmed between multiple responsibilities at his college and job recently, and he describes SI without plan for the past several weeks. He reports that overdose was impulsive. He denies SI/HI/AH/VH. He reports poor sleep, low energy, poor concentration, anhedonia, and guilty feelings prior to admission. He denies symptoms of mania, OCD, and PTSD. He has been using cannabis about twice per week and he denies other illicit substance use.  Discussed with patient about treatment options. He is remorseful regarding his suicide attempt, and he describes having good social support outside the hospital. He is in agreement to trial  of lexapro for depression and trial of trazodone PRN to help with sleep. He was in agreement with the above plan, and he had no further questions, comments, or concerns.   PLAN OF CARE:   -admit to inpatient level of care  -MDD, recurrent, severe, without psychosis             -Start lexapro 58m po qDay  -anxiety             -Start vistaril 254mpo q8h prn anxiety  -insomnia             -Start trazodone 5091mo qhs prn insomnia  -Encourage participation in groups and therapeutic milieu  -disposition planning will be ongoing    ChrMaris BergerD

## 2018-03-01 NOTE — Plan of Care (Signed)
Patient was pleasant with a sad/depressed affect. Patient said he slept well and denied SI HI AVH. Denies physical pain, denies anxiety. Patient voiced no complaints. Patient safety is maintained with 15 minute checks as well as environmental checks. Will continue to monitor.  Problem: Education: Goal: Knowledge of  General Education information/materials will improve Outcome: Progressing   Problem: Activity: Goal: Interest or engagement in activities will improve Outcome: Progressing Goal: Sleeping patterns will improve Outcome: Progressing   Problem: Coping: Goal: Ability to demonstrate self-control will improve Outcome: Progressing   Problem: Education: Goal: Emotional status will improve Outcome: Not Progressing Goal: Mental status will improve Outcome: Not Progressing

## 2018-03-01 NOTE — Progress Notes (Signed)
Pt is requesting outpatient medication management/therapy and is currently attending school/unable to do PCP or IOP. Pt insurance not accepted by [facility]. Referral made to [facility].  Tollie Canada S. Alan Ripper, MSW, LCSW Clinical Social Worker 03/01/2018 2:12 PM

## 2018-03-01 NOTE — Progress Notes (Signed)
Nursing Progress Note: 7p-7a D: Pt currently presens with a cooperative/sad/flat/depressed affect and behavior. Pt states "I am having a better day today. I really haven't been able to get any good sleep. It's been difficult." Interacting appropriately with the milieu. Pt reports poor sleep during the previous night with current medication regimen. Pt did attend wrap-up group.  A: Pt provided with medications per providers orders. Pt's labs and vitals were monitored throughout the night. Pt supported emotionally and encouraged to express concerns and questions. Pt educated on medications.  R: Pt's safety ensured with 15 minute and environmental checks. Pt currently denies SI, HI, and AVH. Pt verbally contracts to seek staff if SI,HI, or AVH occurs and to consult with staff before acting on any harmful thoughts. Will continue to monitor.

## 2018-03-01 NOTE — [facility] Suicide Risk Assessment (Signed)
[facility] INPATIENT:  Family/Significant Other Suicide Prevention Education  Suicide Prevention Education:  Contact Attempts: Mavryk Pino (pt's mother) [phone] has been identified by the patient as the family member/significant other with whom the patient will be residing, and identified as the person(s) who will aid the patient in the event of a mental health crisis.  With written consent from the patient, two attempts were made to provide suicide prevention education, prior to and/or following the patient's discharge.  We were unsuccessful in providing suicide prevention education.  A suicide education pamphlet was given to the patient to share with family/significant other.  Date and time of first attempt: 1:55PM on 03/01/18 (voicemail left requesting call back at her earliest convenience.   Rona Ravens LCSW 03/01/2018, 1:56 PM   SPE completed with pt's mother. Aftercare also reviewed. Pt's mother has no safety concerns regarding pt returning home. She is hoping that pt will get medication assistance for his insomnia. "His father had severe insomnia and Eulas has had problems sleeping since middle school." pt's mother confirmed that there are no weapons/firearms in the home.  Bryson Palen S. Alan Ripper, MSW, LCSW Clinical Social Worker 03/02/2018 11:49 AM

## 2018-03-01 NOTE — [facility] Suicide Risk Assessment (Signed)
[facility] Admission Suicide Risk Assessment   Nursing information obtained from:  Patient Demographic factors:  Male, Adolescent or young adult Current Mental Status:  Suicidal ideation indicated by patient, Self-harm thoughts, Self-harm behaviors Loss Factors:  NA Historical Factors:  Family history of mental illness or substance abuse Risk Reduction Factors:  Living with another person, especially a relative, Positive coping skills or problem solving skills  Total Time spent with patient: 1 hour Principal Problem: MDD (major depressive disorder), severe (HCC) Diagnosis:   Patient Active Problem List   Diagnosis Date Noted  . MDD (major depressive disorder), severe (HCC) [F32.2] 02/28/2018   Subjective Data:   Johnny Vargas is a 22 y/o M without formal psychiatric history who was admitted voluntarily after a a suicide attempt on benadryl and melatonin. He was medically cleared and then transferred to [facility] for additional treatment and stabilization.  Upon initial interview, pt shares, "I tried to kill myself, but it didn't work." Pt describes worsening depression associated with feeling overwhelmed between multiple responsibilities at his college and job recently, and he describes SI without plan for the past several weeks. He reports that overdose was impulsive. He denies SI/HI/AH/VH. He reports poor sleep, low energy, poor concentration, anhedonia, and guilty feelings prior to admission. He denies symptoms of mania, OCD, and PTSD. He has been using cannabis about twice per week and he denies other illicit substance use.  Discussed with patient about treatment options. He is remorseful regarding his suicide attempt, and he describes having good social support outside the hospital. He is in agreement to trial of lexapro for depression and trial of trazodone PRN to help with sleep. He was in agreement with the above plan, and he had no further questions, comments, or concerns.   Continued Clinical  Symptoms:  Alcohol Use Disorder Identification Test Final Score (AUDIT): 2 The "Alcohol Use Disorders Identification Test", Guidelines for Use in Primary Care, Second Edition.  World Science writer [facility]). Score between 0-7:  no or low risk or alcohol related problems. Score between 8-15:  moderate risk of alcohol related problems. Score between 16-19:  high risk of alcohol related problems. Score 20 or above:  warrants further diagnostic evaluation for alcohol dependence and treatment.   CLINICAL FACTORS:   Severe Anxiety and/or Agitation Depression:   Impulsivity   Musculoskeletal: Strength & Muscle Tone: within normal limits Gait & Station: normal Patient leans: N/A  Psychiatric Specialty Exam: Physical Exam  Nursing note and vitals reviewed.   Review of Systems  Constitutional: Negative for chills and fever.  Respiratory: Negative for cough and shortness of breath.   Cardiovascular: Negative for chest pain.  Gastrointestinal: Negative for abdominal pain, heartburn, nausea and vomiting.  Psychiatric/Behavioral: Positive for depression. Negative for hallucinations and suicidal ideas. The patient is not nervous/anxious and does not have insomnia.     Blood pressure 115/88, pulse 85, temperature 100.1 F (37.8 C), temperature source Oral, resp. rate 16, height 5\' 10"  (1.778 m), weight 65.3 kg.Body mass index is 20.66 kg/m.  General Appearance: Casual and Fairly Groomed  Eye Contact:  Good  Speech:  Clear and Coherent and Normal Rate  Volume:  Normal  Mood:  Depressed  Affect:  Appropriate, Congruent, Constricted and Depressed  Thought Process:  Coherent and Goal Directed  Orientation:  Full (Time, Place, and Person)  Thought Content:  Logical  Suicidal Thoughts:  No  Homicidal Thoughts:  No  Memory:  Immediate;   Fair Recent;   Fair Remote;   Fair  Judgement:  Poor  Insight:  Lacking  Psychomotor Activity:  Normal  Concentration:  Concentration: Fair  Recall:   [employer] of Knowledge:  Fair  Language:  Fair  Akathisia:  No  Handed:    AIMS (if indicated):     Assets:  Resilience Social Support  ADL's:  Intact  Cognition:  WNL  Sleep:  Number of Hours: 3.75    COGNITIVE FEATURES THAT CONTRIBUTE TO RISK:  None    SUICIDE RISK:   Mild:  Suicidal ideation of limited frequency, intensity, duration, and specificity.  There are no identifiable plans, no associated intent, mild dysphoria and related symptoms, good self-control (both objective and subjective assessment), few other risk factors, and identifiable protective factors, including available and accessible social support.  PLAN OF CARE:   -admit to inpatient level of care  -MDD, recurrent, severe, without psychosis   -Start lexapro 10mg  po qDay  -anxiety  -Start vistaril 25mg  po q8h prn anxiety  -insomnia   -Start trazodone 50mg  po qhs prn insomnia  -Encourage participation in groups and therapeutic milieu  -disposition planning will be ongoing  I certify that inpatient services furnished can reasonably be expected to improve the patient's condition.   Micheal Likens, MD 03/01/2018, 4:45 PM

## 2018-03-01 NOTE — [facility] Counselor (Signed)
Adult Comprehensive Assessment  Patient ID: Johnny Vargas, male   DOB: [birth year], 22 y.o.   MRN: [id number]  Information Source: Information source: Patient  Current Stressors:  Patient states their primary concerns and needs for treatment are:: depression, difficulty communicating and coping with emotions, SI attempt by hanging. Patient states their goals for this hospitilization and ongoing recovery are:: "I guess I want to figure out how to deal with my emotions better and use coping skills." Educational / Learning stressors: [employer] Employment / Job issues: quit job at [employer] about one month ago "to focus more on school." Family Relationships: close to mother and stepfather. close to older sister. biological father died from cancer when he was Advertising copywriter / Lack of resources (include bankruptcy): support from mother; private insurance Housing / Lack of housing: lives in home with mother and stepfather Physical health (include injuries & life threatening diseases): none identified Social relationships: some close friends in community. family is also very supportive Substance abuse: intermittent marijuana use--"about once a week."  Bereavement / Loss: none reported.   Living/Environment/Situation:  Living Arrangements: Parent Living conditions (as described by patient or guardian): good living conditions. lives in house Who else lives in the home?: mother and stepfather How long has patient lived in current situation?: all his life. family recently moved from  What is atmosphere in current home: Comfortable, Loving, Supportive  Family History:  Marital status: Single Are you sexually active?: Yes What is your sexual orientation?: heterosexual Has your sexual activity been affected by drugs, alcohol, medication, or emotional stress?: n/a  Does patient have children?: No  Childhood History:  By whom was/is the patient raised?: Mother/father and step-parent Additional childhood  history information: father died from cancer when he was 50. mother and stepfather raised him Description of patient's relationship with caregiver when they were a child: close to parents Patient's description of current relationship with people who raised him/her: close to parents  How were you disciplined when you got in trouble as a child/adolescent?: grounded;  Does patient have siblings?: Yes Number of Siblings: 1 Description of patient's current relationship with siblings: 22yo sister. "we are close."  Did patient suffer any verbal/emotional/physical/sexual abuse as a child?: No Did patient suffer from severe childhood neglect?: No Has patient ever been sexually abused/assaulted/raped as an adolescent or adult?: No Was the patient ever a victim of a crime or a disaster?: No Witnessed domestic violence?: No Has patient been effected by domestic violence as an adult?: No  Education:  Highest grade of school patient has completed: high Garment/textile technologist and some college Currently a student?: Yes Name of school: [employer] How long has the patient attended?: few semesters  Learning disability?: No  Employment/Work Situation:   Employment situation: Unemployed Patient's job has been impacted by current illness: No What is the longest time patient has a held a job?: "I quit my job at [employer] last month to focus on school."  Where was the patient employed at that time?: 4 months Did You Receive Any Psychiatric Treatment/Services While in the [employer]?: No Are There Guns or Other Weapons in Your Home?: No Are These Comptroller?: (n/a)  Financial Resources:   Financial resources: Support from parents / caregiver, Media planner Does patient have a Lawyer or guardian?: No  Alcohol/Substance Abuse:   What has been your use of drugs/alcohol within the last 12 months?: marijuana once weekly on average. pt denies alcohol or drug use.  If attempted suicide, did  drugs/alcohol play a role in this?: No(pt attempted to hang himself prior to admission and overdosed on benadryl and melatonin) Alcohol/Substance Abuse Treatment Hx: Denies past history If yes, describe treatment: n/a  Has alcohol/substance abuse ever caused legal problems?: No  Social Support System:   Patient's Community Support System: Good Describe Community Support System: pt reports supportive network of friends and family Type of faith/religion: none How does patient's faith help to cope with current illness?: n/a   Leisure/Recreation:   Leisure and Hobbies: skateboarding and working on Building services engineer:   What is the patient's perception of their strengths?: smart, insightful, motivated to get well/learn better coping skills Patient states they can use these personal strengths during their treatment to contribute to their recovery: "I'm not here voluntarily but I'm willing to learn coping skills and talk to the doctor about medication." Patient states these barriers may affect/interfere with their treatment: none identified Patient states these barriers may affect their return to the community: none identified Other important information patient would like considered in planning for their treatment: none identified   Discharge Plan:   Currently receiving community mental health services: No Patient states concerns and preferences for aftercare planning are: pt interested in outpatient medication management and therapy--[facility]/possibly PHP or Psych IOP. CSW continuing to assess.  Patient states they will know when they are safe and ready for discharge when: "I feel better already." Pt understands the severity of suicide attempt and is agreeable to stay for 3-4 days to learn coping skills and work on medication management Does patient have access to transportation?: Yes(drives and has family that can drive him) Does patient have financial barriers related to  discharge medications?: No Patient description of barriers related to discharge medications: none identified  Will patient be returning to same living situation after discharge?: Yes(pt will return home)  Summary/Recommendations:   Summary and Recommendations (to be completed by the evaluator): Patient is 22yo male living in [place], [place] with his mother and stepfather. Pt presents to the hospital due to intentional overdose and attempted hanging, increased depression/mood lability, and for medication stabilization. Pt reports occassional marijuana use but denies any other substance abuse. Pt denies SI/HI/AVH currently. He lives with his parents, is single with no children, and is a Consulting civil engineer at [employer]. Pt reports difficulty managing and coping with emotions. Pt reports that his father died from cancer when he was a child. Pt has a primary diagnosis of MDD. Recommenations for pt include: crisis stabilization, therapeutic milieu, encourage group attendance and participation, medication management for mood stabilization, and development of comrehensive mental wellness plan. CSW assessing for appropriate referrals.   Rona Ravens LCSW 03/01/2018 11:29 AM

## 2018-03-01 NOTE — [facility] Group Notes (Signed)
[facility] Mental Health Association Group Therapy 03/01/2018 1:15pm  Type of Therapy: Mental Health Association Presentation  Participation Level: Active  Participation Quality: Attentive  Affect: Appropriate  Cognitive: Oriented  Insight: Developing/Improving  Engagement in Therapy: Engaged  Modes of Intervention: Discussion, Education and Socialization  Summary of Progress/Problems: Mental Health Association (MHA) Speaker came to talk about his personal journey with mental health. The pt processed ways by which to relate to the speaker. MHA speaker provided handouts and educational information pertaining to groups and services offered by the MHA. Pt was engaged in speaker's presentation and was receptive to resources provided.    Arbie Blankley S See Beharry, LCSW 03/01/2018 10:01 AM  

## 2018-03-02 DIAGNOSIS — G4700 Insomnia, unspecified: Secondary | ICD-10-CM

## 2018-03-02 DIAGNOSIS — T450X2D Poisoning by antiallergic and antiemetic drugs, intentional self-harm, subsequent encounter: Secondary | ICD-10-CM

## 2018-03-02 DIAGNOSIS — T1491XD Suicide attempt, subsequent encounter: Secondary | ICD-10-CM

## 2018-03-02 DIAGNOSIS — F17290 Nicotine dependence, other tobacco product, uncomplicated: Secondary | ICD-10-CM

## 2018-03-02 DIAGNOSIS — F419 Anxiety disorder, unspecified: Secondary | ICD-10-CM

## 2018-03-02 NOTE — Progress Notes (Signed)
Recreation Therapy Notes  Date: 10.18.19 Time: 0930 Location: [address] Dayroom  Group Topic: Stress Management  Goal Area(s) Addresses:  Patient will verbalize importance of using healthy stress management.  Patient will identify positive emotions associated with healthy stress management.   Intervention: Stress Management  Activity : Progressive Muscle Relaxation.  LRT introduced the stress management technique of progressive muscle relaxation.  LRT read a script to guide patients in tensing each muscle group individually then relaxing them. Patients were to follow along as the script was read.  Education:  Stress Management, Discharge Planning.   Education Outcome: Acknowledges edcuation/In group clarification offered/Needs additional education  Clinical Observations/Feedback: Pt did not attend group.    Caroll Rancher, LRT/CTRS         Lillia Abed, Shandale Malak A 03/02/2018 11:11 AM

## 2018-03-02 NOTE — Progress Notes (Addendum)
[facility] MD Progress Note  03/02/2018 11:46 AM Johnny Vargas  MRN:  [id number]  Subjective: Johnny Vargas reports, "I'm just a little sleepy this morning. I could not get up for breakfast. I am not really feeling depressed or anxious today. I started the medicines, may be that is the reason that I feel sleepy this morning. Other than the sleepiness, I'm having no other side effects".  Johnny Vargas is a 22 y/o M without formal psychiatric history who was admitted voluntarily after a a suicide attempt on benadryl and melatonin. He was medically cleared and then transferred to [facility] for additional treatment and stabilization. Upon initial interview, pt shares, "I tried to kill myself, but it didn't work." Pt describes worsening depression associated with feeling overwhelmed between multiple responsibilities at his college and job recently, and he describes SI without plan for the past several weeks. He reports that overdose was impulsive. He denies SI/HI/AH/VH. He reports poor sleep, low energy, poor concentration, anhedonia, and guilty feelings prior to admission. He denies symptoms of mania, OCD, and PTSD. He has been using cannabis about twice per week and he denies other illicit substance use.  Johnny Vargas is seen, chart reviewed. The chart findings discussed with the treatment team. He is lying down in bed complaining of feeling sleepy. He presents with a good affect & making good eye contact. He denies any symptoms of depression or anxiety today. He is taking & tolerating his medications so far. He has not been visible on the unit this morning because he says he was feeling very sleepy. He says he didattend 2 group sessions yesterday. He is encouraged to continue to attend group sessions to learn coping skills. He denies any SIHI, AVH, delusional thoughts or paranoia. He does not appear to be responding to any internal stimuli. Johnny Vargas has agreed to continue current plan of care as already in progress. No changes made on  the current plan of care.  Principal Problem: MDD (major depressive disorder), recurrent severe, without psychosis (HCC)  Diagnosis:   Patient Active Problem List   Diagnosis Date Noted  . MDD (major depressive disorder), recurrent severe, without psychosis (HCC) [F33.2] 02/28/2018    Priority: High   Total Time spent with patient: 25 minutes  Past Psychiatric History: See H&O  Past Medical History:  Past Medical History:  Diagnosis Date  . Anxiety    History reviewed. No pertinent surgical history.  Family History: History reviewed. No pertinent family history.  Family Psychiatric  History: See H&P  Social History:  Social History   Substance and Sexual Activity  Alcohol Use Yes     Social History   Substance and Sexual Activity  Drug Use Yes  . Types: Marijuana    Social History   Socioeconomic History  . Marital status: Single    Spouse name: Not on file  . Number of children: Not on file  . Years of education: Not on file  . Highest education level: Not on file  Occupational History  . Not on file  Social Needs  . Financial resource strain: Not on file  . Food insecurity:    Worry: Not on file    Inability: Not on file  . Transportation needs:    Medical: Not on file    Non-medical: Not on file  Tobacco Use  . Smoking status: Current Every Day Smoker    Types: E-cigarettes  . Smokeless tobacco: Never Used  Substance and Sexual Activity  . Alcohol use: Yes  . Drug  use: Yes    Types: Marijuana  . Sexual activity: Yes  Lifestyle  . Physical activity:    Days per week: Not on file    Minutes per session: Not on file  . Stress: Not on file  Relationships  . Social connections:    Talks on phone: Not on file    Gets together: Not on file    Attends religious service: Not on file    Active member of club or organization: Not on file    Attends meetings of clubs or organizations: Not on file    Relationship status: Not on file  Other Topics  Concern  . Not on file  Social History Narrative  . Not on file   Additional Social History:   Sleep: Fair  Appetite:  Fair  Current Medications: Current Facility-Administered Medications  Medication Dose Route Frequency Provider Last Rate Last Dose  . acetaminophen (TYLENOL) tablet 650 mg  650 mg Oral Q4H PRN Rankin, Shuvon B, NP      . escitalopram (LEXAPRO) tablet 10 mg  10 mg Oral Daily Dontrez Pettis I, NP   10 mg at 03/01/18 1728  . fluticasone (FLONASE) 50 MCG/ACT nasal spray 2 spray  2 spray Each Nare Daily Rankin, Shuvon B, NP      . hydrOXYzine (ATARAX/VISTARIL) tablet 25 mg  25 mg Oral TID PRN [name] A, NP   25 mg at 03/01/18 2351  . ipratropium (ATROVENT) 0.06 % nasal spray 2 spray  2 spray Each Nare QID Rankin, Shuvon B, NP      . traZODone (DESYREL) tablet 50 mg  50 mg Oral QHS,MR X 1 [name] A, NP   50 mg at 03/01/18 2351    Lab Results: No results found for this or any previous visit (from the past 48 hour(s)).  Blood Alcohol level:  Lab Results  Component Value Date   ETH <10 02/27/2018   Metabolic Disorder Labs: No results found for: HGBA1C, MPG No results found for: PROLACTIN No results found for: CHOL, TRIG, HDL, CHOLHDL, VLDL, LDLCALC  Physical Findings: AIMS: Facial and Oral Movements Muscles of Facial Expression: None, normal Lips and Perioral Area: None, normal Jaw: None, normal Tongue: None, normal,Extremity Movements Upper (arms, wrists, hands, fingers): None, normal Lower (legs, knees, ankles, toes): None, normal, Trunk Movements Neck, shoulders, hips: None, normal, Overall Severity Severity of abnormal movements (highest score from questions above): None, normal Incapacitation due to abnormal movements: None, normal Patient's awareness of abnormal movements (rate only patient's report): No Awareness, Dental Status Current problems with teeth and/or dentures?: No Does patient usually wear dentures?: No  CIWA:    COWS:      Musculoskeletal: Strength & Muscle Tone: within normal limits Gait & Station: normal Patient leans: N/A  Psychiatric Specialty Exam: Physical Exam  Nursing note and vitals reviewed.   Review of Systems  Psychiatric/Behavioral: Positive for depression ("Improving"). Negative for hallucinations, memory loss and suicidal ideas. Substance abuse: Hx. THC use. The patient is not nervous/anxious and does not have insomnia.     Blood pressure (!) 92/54, pulse 62, temperature 98.2 F (36.8 C), resp. rate 16, height 5\' 10"  (1.778 m), weight 65.3 kg.Body mass index is 20.66 kg/m.  General Appearance: Casual and Fairly Groomed  Eye Contact:  Good  Speech:  Clear and Coherent and Normal Rate  Volume:  Normal  Mood:  Depressed  Affect:  Appropriate, Congruent, Constricted and Depressed  Thought Process:  Coherent and Goal Directed  Orientation:  Full (Time, Place, and Person)  Thought Content:  Logical  Suicidal Thoughts:  No  Homicidal Thoughts:  No  Memory:  Immediate;   Fair Recent;   Fair Remote;   Fair  Judgement:  Poor  Insight:  Lacking  Psychomotor Activity:  Normal  Concentration:  Concentration: Fair  Recall:  [employer] of Knowledge:  Fair  Language:  Fair  Akathisia:  No  Handed:    AIMS (if indicated):     Assets:  Resilience Social Support  ADL's:  Intact  Cognition:  WNL  Sleep:  Number of Hours: 5.0     Treatment Plan Summary: Daily contact with patient to assess and evaluate symptoms and progress in treatment.  - Continue inpatient hospitalization.  - Will continue today 03/02/2018 plan as below except where it is noted.  -MDD, recurrent, severe, without psychosis             -Start lexapro 10mg  po qDay  -anxiety             -Continue vistaril 25mg  po q8h prn anxiety  -insomnia             -Continue trazodone 50mg  po qhs prn insomnia  -Encourage participation in groups and therapeutic milieu  -disposition planning will be ongoing  Armandina Stammer, NP, PMHNP, FNP-BC. 03/02/2018, 11:46 AM

## 2018-03-02 NOTE — Plan of Care (Signed)
Progress report  D: pt found in bed; noncompliant with medication administration. Pt allowed to rest. Pt declined to fill out his self inventory. Pt seems guarded and reserved. Pt short with his answers and ambivalent in his care. Pt denies any physical pain, rating this a 0/10. Pt denies any si/hi/ah/vh and verbally agrees to approach staff if these become apparent or before harming himself while at [facility]. A: pt provided support and encouragement. Pt given medications per protocol and standing orders. Q19m safety checks implemented and continued.  R: pt safe on the unit. Will continue to monitor.  Pt progressing in the following metrics  Problem: Coping: Goal: Ability to verbalize frustrations and anger appropriately will improve Outcome: Progressing   Problem: Health Behavior/Discharge Planning: Goal: Identification of resources available to assist in meeting health care needs will improve Outcome: Progressing Goal: Compliance with treatment plan for underlying cause of condition will improve Outcome: Progressing   Problem: Physical Regulation: Goal: Ability to maintain clinical measurements within normal limits will improve Outcome: Progressing   Problem: Safety: Goal: Periods of time without injury will increase Outcome: Progressing   Problem: Education: Goal: Knowledge of [facility] Education information/materials will improve Outcome: Progressing Goal: Emotional status will improve Outcome: Progressing Goal: Verbalization of understanding the information provided will improve Outcome: Progressing

## 2018-03-02 NOTE — [facility] Group Notes (Signed)
LCSW Group Therapy Note  03/02/2018 1:15pm  Type of Therapy and Topic:  Group Therapy:  Feelings around Relapse and Recovery  Participation Level:  Active   Description of Group:    Patients in this group will discuss emotions they experience before and after a relapse. They will process how experiencing these feelings, or avoidance of experiencing them, relates to having a relapse. Facilitator will guide patients to explore emotions they have related to recovery. Patients will be encouraged to process which emotions are more powerful. They will be guided to discuss the emotional reaction significant others in their lives may have to their relapse or recovery. Patients will be assisted in exploring ways to respond to the emotions of others without this contributing to a relapse.  Therapeutic Goals: 1. Patient will identify two or more emotions that lead to a relapse for them 2. Patient will identify two emotions that result when they relapse 3. Patient will identify two emotions related to recovery 4. Patient will demonstrate ability to communicate their needs through discussion and/or role plays   Summary of Patient Progress:  Johnny Vargas was attentive and engaged during today's processing group. He shared that he learned that he must share his struggles with grief with family members in order to work through those feelings rather than lashing out or hurting himself. He identified his mother. Sister, and stepfather as positive supports. Johnny Vargas continues to presents with pleasant mood and lethargic affect--"the medicine is making me groggy and dizzy."    Therapeutic Modalities:   Cognitive Behavioral Therapy Solution-Focused Therapy Assertiveness Training Relapse Prevention Therapy   Rona Ravens, LCSW 03/02/2018 1:43 PM

## 2018-03-02 NOTE — Tx Team (Signed)
Interdisciplinary Treatment and Diagnostic Plan Update  03/02/2018 Time of Session: 0830AM Johnny Vargas MRN: [id number]  Principal Diagnosis: MDD (major depressive disorder), recurrent severe, without psychosis (HCC)  Secondary Diagnoses: Principal Problem:   MDD (major depressive disorder), recurrent severe, without psychosis (HCC)   Current Medications:  Current Facility-Administered Medications  Medication Dose Route Frequency Provider Last Rate Last Dose  . acetaminophen (TYLENOL) tablet 650 mg  650 mg Oral Q4H PRN Rankin, Shuvon B, NP      . escitalopram (LEXAPRO) tablet 10 mg  10 mg Oral Daily Nwoko, Agnes I, NP   10 mg at 03/01/18 1728  . fluticasone (FLONASE) 50 MCG/ACT nasal spray 2 spray  2 spray Each Nare Daily Rankin, Shuvon B, NP      . hydrOXYzine (ATARAX/VISTARIL) tablet 25 mg  25 mg Oral TID PRN [name] A, NP   25 mg at 03/01/18 2351  . ipratropium (ATROVENT) 0.06 % nasal spray 2 spray  2 spray Each Nare QID Rankin, Shuvon B, NP      . traZODone (DESYREL) tablet 50 mg  50 mg Oral QHS,MR X 1 [name] A, NP   50 mg at 03/01/18 2351   PTA Medications: Medications Prior to Admission  Medication Sig Dispense Refill Last Dose  . benzonatate (TESSALON) 100 MG capsule Take 1 capsule (100 mg total) by mouth every 8 (eight) hours. (Patient not taking: Reported on 02/28/2018) 21 capsule 0 Not Taking at Unknown time  . fluticasone (FLONASE) 50 MCG/ACT nasal spray Place 2 sprays into both nostrils daily. (Patient not taking: Reported on 02/28/2018) 1 g 0 Not Taking at Unknown time  . ipratropium (ATROVENT) 0.06 % nasal spray Place 2 sprays into both nostrils 4 (four) times daily. (Patient not taking: Reported on 02/28/2018) 15 mL 0 Not Taking at Unknown time    Patient Stressors: Educational concerns Financial difficulties Occupational concerns  Patient Strengths: Ability for insight Average or above average intelligence General fund of knowledge Motivation for  treatment/growth  Treatment Modalities: Medication Management, Group therapy, Case management,  1 to 1 session with clinician, Psychoeducation, Recreational therapy.   Physician Treatment Plan for Primary Diagnosis: MDD (major depressive disorder), recurrent severe, without psychosis (HCC) Long Term Goal(s): Improvement in symptoms so as ready for discharge Improvement in symptoms so as ready for discharge   Short Term Goals: Ability to identify changes in lifestyle to reduce recurrence of condition will improve Ability to verbalize feelings will improve Ability to disclose and discuss suicidal ideas Ability to identify and develop effective coping behaviors will improve Compliance with prescribed medications will improve Ability to identify triggers associated with substance abuse/mental health issues will improve  Medication Management: Evaluate patient's response, side effects, and tolerance of medication regimen.  Therapeutic Interventions: 1 to 1 sessions, Unit Group sessions and Medication administration.  Evaluation of Outcomes: Progressing  Physician Treatment Plan for Secondary Diagnosis: Principal Problem:   MDD (major depressive disorder), recurrent severe, without psychosis (HCC)  Long Term Goal(s): Improvement in symptoms so as ready for discharge Improvement in symptoms so as ready for discharge   Short Term Goals: Ability to identify changes in lifestyle to reduce recurrence of condition will improve Ability to verbalize feelings will improve Ability to disclose and discuss suicidal ideas Ability to identify and develop effective coping behaviors will improve Compliance with prescribed medications will improve Ability to identify triggers associated with substance abuse/mental health issues will improve     Medication Management: Evaluate patient's response, side effects, and tolerance of  medication regimen.  Therapeutic Interventions: 1 to 1 sessions, Unit Group  sessions and Medication administration.  Evaluation of Outcomes: Progressing   RN Treatment Plan for Primary Diagnosis: MDD (major depressive disorder), recurrent severe, without psychosis (HCC) Long Term Goal(s): Knowledge of disease and therapeutic regimen to maintain health will improve  Short Term Goals: Ability to remain free from injury will improve, Ability to verbalize feelings will improve, Ability to disclose and discuss suicidal ideas and Ability to identify and develop effective coping behaviors will improve  Medication Management: RN will administer medications as ordered by provider, will assess and evaluate patient's response and provide education to patient for prescribed medication. RN will report any adverse and/or side effects to prescribing provider.  Therapeutic Interventions: 1 on 1 counseling sessions, Psychoeducation, Medication administration, Evaluate responses to treatment, Monitor vital signs and CBGs as ordered, Perform/monitor CIWA, COWS, AIMS and Fall Risk screenings as ordered, Perform wound care treatments as ordered.  Evaluation of Outcomes: Progressing   LCSW Treatment Plan for Primary Diagnosis: MDD (major depressive disorder), recurrent severe, without psychosis (HCC) Long Term Goal(s): Safe transition to appropriate next level of care at discharge, Engage patient in therapeutic group addressing interpersonal concerns.  Short Term Goals: Engage patient in aftercare planning with referrals and resources, Facilitate patient progression through stages of change regarding substance use diagnoses and concerns and Identify triggers associated with mental health/substance abuse issues  Therapeutic Interventions: Assess for all discharge needs, 1 to 1 time with Social worker, Explore available resources and support systems, Assess for adequacy in community support network, Educate family and significant other(s) on suicide prevention, Complete Psychosocial  Assessment, Interpersonal group therapy.  Evaluation of Outcomes: Progressing   Progress in Treatment: Attending groups: Yes. Participating in groups: Yes. Taking medication as prescribed: Yes. Toleration medication: Yes. Family/Significant other contact made: Contact attempts made with pt's mother. SPE also completed with pt.  Patient understands diagnosis: Yes. Discussing patient identified problems/goals with staff: Yes. Medical problems stabilized or resolved: Yes. Denies suicidal/homicidal ideation: Yes. Issues/concerns per patient self-inventory: No. Other: n/a   New problem(s) identified: No, Describe:  n/a  New Short Term/Long Term Goal(s): medication management for mood stabilization; elimination of SI thoughts; development of comprehensive mental wellness/sobriety plan.   Patient Goals:  "to get started on some medications and learn better coping skills for my depression."   Discharge Plan or Barriers: Pt has been referred to [facility] for medication management and therapy. He plans to return home with mother and return to school at discharge. [facility] pamphlet, Mobile Crisis information, and AA/NA information provided to patient for additional community support and resources.   Reason for Continuation of Hospitalization: Anxiety Depression Medication stabilization Suicidal ideation  Estimated Length of Stay: Monday, 03/05/18  Attendees: Patient: 03/02/2018 9:21 AM  Physician: Dr. Jama Flavors MD; Dr. Altamese Luyando MD 03/02/2018 9:21 AM  Nursing: Casimiro Needle RN; Liston RN 03/02/2018 9:21 AM  RN Care Manager:x 03/02/2018 9:21 AM  Social Worker: Corrie Mckusick LCSW 03/02/2018 9:21 AM  Recreational Therapist: x 03/02/2018 9:21 AM  Other: Armandina Stammer NP 03/02/2018 9:21 AM  Other:  03/02/2018 9:21 AM  Other: 03/02/2018 9:21 AM    Scribe for Treatment Team: Rona Ravens, LCSW 03/02/2018 9:21 AM

## 2018-03-02 NOTE — Progress Notes (Signed)
D  Pt is pleasant on approach and cooperative   He tends to isolate but his behavior has been appropriate   He requested medication to help him sleep A   Verbal support and encouragement given   Medications administered and effectiveness monitored   Q 15 min checks  R   Pt is safe at this time

## 2018-03-02 NOTE — Progress Notes (Signed)
Pt attended AA group this evening.  

## 2018-03-03 DIAGNOSIS — F17210 Nicotine dependence, cigarettes, uncomplicated: Secondary | ICD-10-CM

## 2018-03-03 MED ORDER — TRAZODONE HCL 50 MG PO TABS
50.0000 mg | ORAL_TABLET | Freq: Every evening | ORAL | Status: DC | PRN
  Administered 2018-03-04: 50 mg via ORAL
  Filled 2018-03-03: qty 1

## 2018-03-03 NOTE — [facility] Group Notes (Signed)
[facility] Group Notes:  (Nursing)  Date:  03/03/2018  Time: 1:15 PM Type of Therapy:  Nurse Education  Participation Level:  Did Not Attend   Shela Nevin 03/03/2018, 7:08 PM

## 2018-03-03 NOTE — Progress Notes (Addendum)
[facility] MD Progress Note  03/03/2018 11:40 AM Johnny Vargas  MRN:  [id number]   Subjective: Patient reports today that he feels that he is doing pretty good.  He states that he is just feeling very very sleepy.  He does not know if it is due to the medications or what the problem is.  He states that he was given 3 pills last night to go to sleep on.  He denies any suicidal or homicidal ideations.  He denies any hallucinations.  When asked what time he went to bed last night he reports approximately 11 PM patient did not wake up on his own today and had to be awakened at approximately 11:30 AM.  He reported that he did not go to the bathroom last night and did not get up to go to breakfast this morning either.  Patient reports that he would like to be discharged soon so that he can follow-up with his outpatient providers and return to his normal life.  Objective: Patient's chart and findings reviewed and discussed with treatment team.  Patient presents in his room sleeping and had to be awakened.  I attempted to wake the patient to other times this morning and he was very very sedated.  After reviewing what patient had stated about 3 pills last night, patient was given 25 mg of Vistaril at 2335 and was also given trazodone 50 mg x2 doses at 2335 and 2336.  This would probably indicate why patient is slept for over 12 hours and then went back to bed after we finished our interview.  Principal Problem: MDD (major depressive disorder), recurrent severe, without psychosis (HCC) Diagnosis:   Patient Active Problem List   Diagnosis Date Noted  . MDD (major depressive disorder), recurrent severe, without psychosis (HCC) [F33.2] 02/28/2018   Total Time spent with patient: 30 minutes  Past Psychiatric History: See H&P  Past Medical History:  Past Medical History:  Diagnosis Date  . Anxiety    History reviewed. No pertinent surgical history. Family History: History reviewed. No pertinent family  history. Family Psychiatric  History: See H&P Social History:  Social History   Substance and Sexual Activity  Alcohol Use Yes     Social History   Substance and Sexual Activity  Drug Use Yes  . Types: Marijuana    Social History   Socioeconomic History  . Marital status: Single    Spouse name: Not on file  . Number of children: Not on file  . Years of education: Not on file  . Highest education level: Not on file  Occupational History  . Not on file  Social Needs  . Financial resource strain: Not on file  . Food insecurity:    Worry: Not on file    Inability: Not on file  . Transportation needs:    Medical: Not on file    Non-medical: Not on file  Tobacco Use  . Smoking status: Current Every Day Smoker    Types: E-cigarettes  . Smokeless tobacco: Never Used  Substance and Sexual Activity  . Alcohol use: Yes  . Drug use: Yes    Types: Marijuana  . Sexual activity: Yes  Lifestyle  . Physical activity:    Days per week: Not on file    Minutes per session: Not on file  . Stress: Not on file  Relationships  . Social connections:    Talks on phone: Not on file    Gets together: Not on file  Attends religious service: Not on file    Active member of club or organization: Not on file    Attends meetings of clubs or organizations: Not on file    Relationship status: Not on file  Other Topics Concern  . Not on file  Social History Narrative  . Not on file   Additional Social History:                         Sleep: Good  Appetite:  Fair  Current Medications: Current Facility-Administered Medications  Medication Dose Route Frequency Provider Last Rate Last Dose  . acetaminophen (TYLENOL) tablet 650 mg  650 mg Oral Q4H PRN Rankin, Shuvon B, NP      . escitalopram (LEXAPRO) tablet 10 mg  10 mg Oral Daily Armandina Stammer I, NP   10 mg at 03/03/18 0906  . fluticasone (FLONASE) 50 MCG/ACT nasal spray 2 spray  2 spray Each Nare Daily Rankin, Shuvon B, NP       . hydrOXYzine (ATARAX/VISTARIL) tablet 25 mg  25 mg Oral TID PRN Jackelyn Poling, NP   25 mg at 03/02/18 2335  . ipratropium (ATROVENT) 0.06 % nasal spray 2 spray  2 spray Each Nare QID Rankin, Shuvon B, NP      . traZODone (DESYREL) tablet 50 mg  50 mg Oral QHS PRN Money, Gerlene Burdock, FNP        Lab Results: No results found for this or any previous visit (from the past 48 hour(s)).  Blood Alcohol level:  Lab Results  Component Value Date   ETH <10 02/27/2018    Metabolic Disorder Labs: No results found for: HGBA1C, MPG No results found for: PROLACTIN No results found for: CHOL, TRIG, HDL, CHOLHDL, VLDL, LDLCALC  Physical Findings: AIMS: Facial and Oral Movements Muscles of Facial Expression: None, normal Lips and Perioral Area: None, normal Jaw: None, normal Tongue: None, normal,Extremity Movements Upper (arms, wrists, hands, fingers): None, normal Lower (legs, knees, ankles, toes): None, normal, Trunk Movements Neck, shoulders, hips: None, normal, Overall Severity Severity of abnormal movements (highest score from questions above): None, normal Incapacitation due to abnormal movements: None, normal Patient's awareness of abnormal movements (rate only patient's report): No Awareness, Dental Status Current problems with teeth and/or dentures?: No Does patient usually wear dentures?: No  CIWA:  CIWA-Ar Total: 0 COWS:  COWS Total Score: 0  Musculoskeletal: Strength & Muscle Tone: within normal limits Gait & Station: normal Patient leans: N/A  Psychiatric Specialty Exam: Physical Exam  Nursing note and vitals reviewed. Constitutional: He is oriented to person, place, and time. He appears well-developed and well-nourished.  Respiratory: Effort normal.  Musculoskeletal: Normal range of motion.  Neurological: He is alert and oriented to person, place, and time.  Skin: Skin is warm.    Review of Systems  Constitutional: Negative.   HENT: Negative.   Eyes: Negative.    Respiratory: Negative.   Cardiovascular: Negative.   Gastrointestinal: Negative.   Genitourinary: Negative.   Musculoskeletal: Negative.   Skin: Negative.   Neurological: Negative.   Endo/Heme/Allergies: Negative.   Psychiatric/Behavioral: Negative.     Blood pressure 104/60, pulse (!) 58, temperature 98.3 F (36.8 C), temperature source Oral, resp. rate 16, height 5\' 10"  (1.778 m), weight 65.3 kg.Body mass index is 20.66 kg/m.  General Appearance: Casual  Eye Contact:  Good  Speech:  Clear and Coherent and Normal Rate  Volume:  Normal  Mood:  Euthymic  Affect:  Congruent  Thought Process:  Goal Directed and Descriptions of Associations: Intact  Orientation:  Full (Time, Place, and Person)  Thought Content:  WDL  Suicidal Thoughts:  No  Homicidal Thoughts:  No  Memory:  Immediate;   Good Recent;   Good Remote;   Good  Judgement:  Fair  Insight:  Fair  Psychomotor Activity:  Normal  Concentration:  Concentration: Good and Attention Span: Good  Recall:  Good  Fund of Knowledge:  Good  Language:  Good  Akathisia:  No  Handed:  Right  AIMS (if indicated):     Assets:  Communication Skills Desire for Improvement Financial Resources/Insurance Housing Physical Health Social Support Transportation  ADL's:  Intact  Cognition:  WNL  Sleep:  Number of Hours: 4.75   Problems addressed MDD severe recurrent  Treatment Plan Summary: Daily contact with patient to assess and evaluate symptoms and progress in treatment, Medication management and Plan is to: Continue Lexapro 10 mg p.o. daily for mood stability Continue Vistaril 25 mg p.o. 3 times daily as needed only for anxiety not sedation Decrease trazodone to only 50 mg nightly as needed for insomnia Encourage group therapy participation  Maryfrances Bunnell, FNP 03/03/2018, 11:40 AM   .Agree with NP Progress Note

## 2018-03-03 NOTE — [facility] Group Notes (Signed)
[facility] Group Notes: (Clinical Social Work)   03/03/2018      Type of Therapy:  Group Therapy   Participation Level:  Did Not Attend despite MHT prompting   Efren Kross Grossman-Orr, LCSW 03/03/2018, 2:57 PM     

## 2018-03-03 NOTE — Progress Notes (Signed)
Pt attended AA group this evening.  

## 2018-03-03 NOTE — Plan of Care (Signed)
D: Patient presents depressed, guarded, lethargic. He complains of feeling dizzy due to medications he received for sleep last night. He reports that he felt the medication was too potent. He slept 12 hours last night, and is isolating to his room, napping. He reports his mood is okay, but he has a flat, depressed affect. Patient denies SI/HI/AVH.  A: Patient checked q15 min, and checks reviewed. Reviewed medication changes with patient and educated on side effects. Educated patient on importance of attending group therapy sessions and educated on several coping skills. Encouarged participation in milieu through recreation therapy and attending meals with peers. Support and encouragement provided. Fluids offered. R: Patient receptive to education on medications, and is medication compliant. Patient contracts for safety on the unit. He declined to fill out his patient self-inventory.

## 2018-03-04 MED ORDER — FLUOXETINE HCL 10 MG PO CAPS
10.0000 mg | ORAL_CAPSULE | Freq: Every day | ORAL | Status: DC
  Administered 2018-03-04: 10 mg via ORAL
  Filled 2018-03-04 (×4): qty 1

## 2018-03-04 NOTE — [facility] Group Notes (Signed)
[facility] Group Notes:  (Nursing)  Date:  03/04/2018  Time: 1:15 PM Type of Therapy:  Nurse Education  Participation Level:  Active  Participation Quality:  Appropriate  Affect:  Appropriate  Cognitive:  Appropriate  Insight:  Appropriate  Engagement in Group:  Engaged  Modes of Intervention:  Discussion and Education  Summary of Progress/Problems: Nurse led group- [employer]  Shela Nevin 03/04/2018, 5:43 PM

## 2018-03-04 NOTE — Progress Notes (Signed)
Patient did attend the evening speaker AA meeting.  

## 2018-03-04 NOTE — Progress Notes (Addendum)
[facility] MD Progress Note  03/04/2018 12:51 PM Johnny Vargas  MRN:  [id number]   Subjective: Patient states that last night he did not sleep very well because he did not get there in time to get his trazodone early enough.  He reports that once he did get a sleep that he slept good.  He denies any suicidal or homicidal ideations and denies any hallucinations today.  He reports that he did not want to take the Lexapro this morning because he is still noticed having some dizzy spells.  He is also interested in potentially trying a different medication before he is discharged.  He reports increased appetite and reports increased mood.  Objective: Patient's chart and findings reviewed and discussed with treatment team.  Patient presents in his room lying in the bed, but he is awake.  He is pleasant, calm, and cooperative.  He is slightly indecisive on using medications but agrees to use a different antidepressant before he is discharged and based off of his reports of having daily fatigue along with his depressive episode we will try a low-dose of Prozac.  He presents with brighter affect and is more talkative today.  Principal Problem: MDD (major depressive disorder), recurrent severe, without psychosis (HCC) Diagnosis:   Patient Active Problem List   Diagnosis Date Noted  . MDD (major depressive disorder), recurrent severe, without psychosis (HCC) [F33.2] 02/28/2018   Total Time spent with patient: 30 minutes  Past Psychiatric History: See H&P  Past Medical History:  Past Medical History:  Diagnosis Date  . Anxiety    History reviewed. No pertinent surgical history. Family History: History reviewed. No pertinent family history. Family Psychiatric  History: See H&P Social History:  Social History   Substance and Sexual Activity  Alcohol Use Yes     Social History   Substance and Sexual Activity  Drug Use Yes  . Types: Marijuana    Social History   Socioeconomic History  . Marital  status: Single    Spouse name: Not on file  . Number of children: Not on file  . Years of education: Not on file  . Highest education level: Not on file  Occupational History  . Not on file  Social Needs  . Financial resource strain: Not on file  . Food insecurity:    Worry: Not on file    Inability: Not on file  . Transportation needs:    Medical: Not on file    Non-medical: Not on file  Tobacco Use  . Smoking status: Current Every Day Smoker    Types: E-cigarettes  . Smokeless tobacco: Never Used  Substance and Sexual Activity  . Alcohol use: Yes  . Drug use: Yes    Types: Marijuana  . Sexual activity: Yes  Lifestyle  . Physical activity:    Days per week: Not on file    Minutes per session: Not on file  . Stress: Not on file  Relationships  . Social connections:    Talks on phone: Not on file    Gets together: Not on file    Attends religious service: Not on file    Active member of club or organization: Not on file    Attends meetings of clubs or organizations: Not on file    Relationship status: Not on file  Other Topics Concern  . Not on file  Social History Narrative  . Not on file   Additional Social History:  Sleep: Good  Appetite:  Good  Current Medications: Current Facility-Administered Medications  Medication Dose Route Frequency Provider Last Rate Last Dose  . acetaminophen (TYLENOL) tablet 650 mg  650 mg Oral Q4H PRN Rankin, Shuvon B, NP      . FLUoxetine (PROZAC) capsule 10 mg  10 mg Oral Daily Money, Gerlene Burdock, FNP   10 mg at 03/04/18 1111  . hydrOXYzine (ATARAX/VISTARIL) tablet 25 mg  25 mg Oral TID PRN Jackelyn Poling, NP   25 mg at 03/02/18 2335  . traZODone (DESYREL) tablet 50 mg  50 mg Oral QHS PRN Money, Gerlene Burdock, FNP        Lab Results: No results found for this or any previous visit (from the past 48 hour(s)).  Blood Alcohol level:  Lab Results  Component Value Date   ETH <10 02/27/2018     Metabolic Disorder Labs: No results found for: HGBA1C, MPG No results found for: PROLACTIN No results found for: CHOL, TRIG, HDL, CHOLHDL, VLDL, LDLCALC  Physical Findings: AIMS: Facial and Oral Movements Muscles of Facial Expression: None, normal Lips and Perioral Area: None, normal Jaw: None, normal Tongue: None, normal,Extremity Movements Upper (arms, wrists, hands, fingers): None, normal Lower (legs, knees, ankles, toes): None, normal, Trunk Movements Neck, shoulders, hips: None, normal, Overall Severity Severity of abnormal movements (highest score from questions above): None, normal Incapacitation due to abnormal movements: None, normal Patient's awareness of abnormal movements (rate only patient's report): No Awareness, Dental Status Current problems with teeth and/or dentures?: No Does patient usually wear dentures?: No  CIWA:  CIWA-Ar Total: 0 COWS:  COWS Total Score: 0  Musculoskeletal: Strength & Muscle Tone: within normal limits Gait & Station: normal Patient leans: N/A  Psychiatric Specialty Exam: Physical Exam  Nursing note and vitals reviewed. Constitutional: He is oriented to person, place, and time. He appears well-developed and well-nourished.  Respiratory: Effort normal.  Musculoskeletal: Normal range of motion.  Neurological: He is alert and oriented to person, place, and time.  Skin: Skin is warm.    Review of Systems  Constitutional: Negative.   HENT: Negative.   Eyes: Negative.   Respiratory: Negative.   Cardiovascular: Negative.   Gastrointestinal: Negative.   Genitourinary: Negative.   Musculoskeletal: Negative.   Skin: Negative.   Neurological: Negative.   Endo/Heme/Allergies: Negative.   Psychiatric/Behavioral: Negative.     Blood pressure 120/78, pulse 77, temperature 98.3 F (36.8 C), temperature source Oral, resp. rate 16, height 5\' 10"  (1.778 m), weight 65.3 kg.Body mass index is 20.66 kg/m.  General Appearance: Casual  Eye  Contact:  Good  Speech:  Clear and Coherent and Normal Rate  Volume:  Normal  Mood:  Euthymic  Affect:  Congruent  Thought Process:  Goal Directed and Descriptions of Associations: Intact  Orientation:  Full (Time, Place, and Person)  Thought Content:  WDL  Suicidal Thoughts:  No  Homicidal Thoughts:  No  Memory:  Immediate;   Good Recent;   Good Remote;   Good  Judgement:  Fair  Insight:  Fair  Psychomotor Activity:  Normal  Concentration:  Concentration: Good and Attention Span: Good  Recall:  Good  Fund of Knowledge:  Good  Language:  Good  Akathisia:  No  Handed:  Right  AIMS (if indicated):     Assets:  Communication Skills Desire for Improvement Financial Resources/Insurance Housing Physical Health Social Support Transportation  ADL's:  Intact  Cognition:  WNL  Sleep:  Number of Hours: 4.75   Problems  addressed MDD severe recurrent  Treatment Plan Summary: Daily contact with patient to assess and evaluate symptoms and progress in treatment, Medication management and Plan is to: Discontinue Lexapro  Start Prozac 10 mg PO Daily for mood stability Continue Vistaril 25 mg p.o. 3 times daily as needed only for anxiety not sedation Continue trazodone to only 50 mg nightly as needed for insomnia Encourage group therapy participation Potential discharge for tomorrow 03/05/2018  Maryfrances Bunnell, FNP 03/04/2018, 12:51 PM   ..Agree with NP Progress Note

## 2018-03-04 NOTE — Progress Notes (Signed)
D    Pt attempting to staff split and is having difficulty following the rules on the unit   He is argumentative when staff is trying to explain the rules and address his behavior    He continues to attempt to stay in the 400 dayroom   He said there are some really cool girls over on that hall A    Verbal support given   Redirection as needed and limit setting ,with choices  Q 15 min checks R   Pt is safe at this time

## 2018-03-04 NOTE — [facility] Group Notes (Signed)
[facility] LCSW Group Therapy Note  03/04/2018  10:00-11:00AM  Type of Therapy and Topic:  Group Therapy:  Adding Supports Including Being Your Own Support  Participation Level:  Active   Description of Group:  Patients in this group were introduced to the concept that additional supports including self-support are an essential part of recovery.  A song entitled "I Need Help!" was played and a group discussion was held in reaction to the idea of needing to add supports.  A song entitled "My Own Hero" was played and a group discussion ensued in which patients stated they could relate to the song and it inspired them to realize they have be willing to help themselves in order to succeed, because other people cannot achieve sobriety or stability for them.  We discussed adding a variety of healthy supports to address the various needs in their lives.  A song was played called "I Know Where I've Been" toward the end of group and used to conduct an inspirational wrap-up to group of remembering how far they have already come in their journey.  Therapeutic Goals: 1)  demonstrate the importance of being a part of one's own support system 2)  discuss reasons people in one's life may eventually be unable to be continually supportive  3)  identify the patient's current support system and   4)  elicit commitments to add healthy supports and to become more conscious of being self-supportive   Summary of Patient Progress:  The patient expressed that his family and friends, as well as he himself, can sometimes be a healthy support and sometimes unhealthy.  He expressed that he does not feel he needs medication, but is very interested in having therapy when he leaves the hospital, which is something he has never done before.   Therapeutic Modalities:   Motivational Interviewing Activity  Lynnell Chad

## 2018-03-04 NOTE — Progress Notes (Signed)
D: Patient assessed and is apathetic with minimal participation. He isolated to his room, until the early afternoon. During snack time he grabbed a large number of snacks (more than he could consume) and spoke disrespectfully to staff. He was defiant and lied, stating, "I only took two goldfish and these nuts." Two other staff confirmed that he had taken approximately 20 bags of goldfish. He was collaborating with peers on the 400 unit that he was allowed to program on. He was not listening to staff and instead talking to peers. This staff splitting behavior was also observed yesterday where made similar accusations about poor care to the provider. We have tried to address any of his concerns multiple times. A: Instructed patient that will program on the 300 unit for the remainder of his stay, as moving him appeared to increase negative collaboration and staff splitting behavior. Patient checked q15 min, and checks reviewed. Reviewed medication changes with patient and educated on side effects.  R: Patient receptive to education on medications, and was medication compliant after medications were adjusted by provider. Patient contracts for safety on the unit.

## 2018-03-04 NOTE — Plan of Care (Signed)
D: Patient presents depressed, irritable. He complained to staff last night that he did not feel he was receiving the best medications for him here. He also complained of feeling dizzy from his Lexapro. Provider changed to Prozac. He slept poorly last night, and did not request medication prior to 2am for sleep. His appetite is fair, energy normal and concentration poor. He rates his depression 2/10, hopelessness 0/10, and anxiety 3/10. He denies withdrawal symptoms, but complains of headache. Patient denies SI/HI/AVH.  A: Patient checked q15 min, and checks reviewed. Reviewed medication changes with patient and educated on side effects. Educated patient on importance of attending group therapy sessions and educated on several coping skills. Encouarged participation in milieu through recreation therapy and attending meals with peers. Support and encouragement provided. Fluids offered. R: Patient receptive to education on medications, and is medication compliant. Patient contracts for safety on the unit. Goal: "Make it to dinner" and "go to dinner" and "Just give me a discharge date!!"

## 2018-03-05 MED ORDER — FLUOXETINE HCL 10 MG PO CAPS: 10.0000 mg | ORAL_CAPSULE | Freq: Every day | ORAL | 0 refills | Status: DC

## 2018-03-05 MED ORDER — TRAZODONE HCL 50 MG PO TABS: 50.0000 mg | ORAL_TABLET | Freq: Every evening | ORAL | 1 refills | Status: DC | PRN

## 2018-03-05 NOTE — Progress Notes (Signed)
Recreation Therapy Notes  Date: 10.21.19 Time: 0930 Location: 300 Hall Dayroom  Group Topic: Stress Management  Goal Area(s) Addresses:  Patient will verbalize importance of using healthy stress management.  Patient will identify positive emotions associated with healthy stress management.   Behavioral Response: Engaged  Intervention: Stress Management  Activity : Meditation.  LRT introduced the stress management technique of meditation.  LRT played a meditation that dealt with impermanence.  Patients were to follow along as the meditation played to engage in the meditation.  Education:  Stress Management, Discharge Planning.   Education Outcome: Acknowledges edcuation/In group clarification offered/Needs additional education  Clinical Observations/Feedback: Pt attended and participated in group.    Caroll Rancher, LRT/CTRS     Lillia Abed, Minka Knight A 03/05/2018 10:58 AM

## 2018-03-05 NOTE — [facility] Suicide Risk Assessment (Signed)
[facility] Discharge Suicide Risk Assessment   Principal Problem: MDD (major depressive disorder), recurrent severe, without psychosis (HCC) Discharge Diagnoses:  Patient Active Problem List   Diagnosis Date Noted  . MDD (major depressive disorder), recurrent severe, without psychosis (HCC) [F33.2] 02/28/2018    Total Time spent with patient: 30 minutes  Psychiatric Specialty Exam: Review of Systems  Constitutional: Negative for chills and fever.  Respiratory: Negative for cough and shortness of breath.   Cardiovascular: Negative for chest pain.  Gastrointestinal: Negative for abdominal pain, heartburn, nausea and vomiting.  Psychiatric/Behavioral: Negative for depression, hallucinations and suicidal ideas. The patient is not nervous/anxious and does not have insomnia.     Blood pressure 120/78, pulse 77, temperature 98.3 F (36.8 C), temperature source Oral, resp. rate 16, height 5\' 10"  (1.778 m), weight 65.3 kg.Body mass index is 20.66 kg/m.   Mental Status Per Nursing Assessment::   On Admission:  Suicidal ideation indicated by patient, Self-harm thoughts, Self-harm behaviors  Demographic Factors:  Male, Adolescent or young adult and Low socioeconomic status  Loss Factors: Financial problems/change in socioeconomic status  Historical Factors: Impulsivity  Risk Reduction Factors:   Sense of responsibility to family, Positive social support, Positive therapeutic relationship and Positive coping skills or problem solving skills  Continued Clinical Symptoms:  Depression:   Impulsivity  Cognitive Features That Contribute To Risk:  None    Suicide Risk:  Minimal: No identifiable suicidal ideation.  Patients presenting with no risk factors but with morbid ruminations; may be classified as minimal risk based on the severity of the depressive symptoms  Follow-up Information    [facility] Follow up on 03/16/2018.   Why:  Medication management appt with Crystal on  Friday, 11/1 at 2:00PM. Please arrive 15 minutes early and bring insurance card. Therapy appt with Ronalee Belts on Thursday, 11/14 at 2:00PM. Thank you.  Contact information: [address] [place] [address] [phone]           Plan Of Care/Follow-up recommendations:  Activity:  as tolerated Diet:  normal Tests:  NA Other:  see above for DC plan  Micheal Likens, MD 03/05/2018, 10:57 AM

## 2018-03-05 NOTE — Progress Notes (Signed)
  [facility] Adult Case Management Discharge Plan :  Will you be returning to the same living situation after discharge:  Yes,  home At discharge, do you have transportation home?: Yes,  parent Do you have the ability to pay for your medications: Yes,  [employer] insurance  Release of information consent forms completed and submitted to medical records by CSW.   Patient to Follow up at: Follow-up Information    [facility] Follow up on 03/16/2018.   Why:  Medication management appt with Crystal on Friday, 11/1 at 2:00PM. Please arrive 15 minutes early and bring insurance card. Therapy appt with Ronalee Belts on Thursday, 11/14 at 2:00PM. Thank you.  Contact information: [address] [place] [address] [phone]           Next level of care provider has access to [facility] Link:no  Safety Planning and Suicide Prevention discussed: Yes,  SPE completed with pt's mother and pt. SPI pamphlet and Mobile Crisis information provided.   Have you used any form of tobacco in the last 30 days? (Cigarettes, Smokeless Tobacco, Cigars, and/or Pipes): Yes  Has patient been referred to the Quitline?: Patient refused referral  Patient has been referred for addiction treatment: Yes  Rona Ravens, LCSW 03/05/2018, 8:55 AM

## 2018-03-05 NOTE — Plan of Care (Signed)
D: Pt A & O X 3. Denies SI, HI, AVH and pain at this time. Patient refused morning medication, prozac. Minimal participation in assessment. Shows no motivation or compliance with treatment plan. Asking to D/C. D/C home as ordered. Picked up in lobby by girlfriend. A: D/C instructions reviewed with pt including prescriptions, medication samples and follow up appointment, compliance encouraged. All belongings given to pt at time of departure. Safety checks maintained without incident till time of d/c.  R: Pt receptive to care. Denies adverse drug reactions when assessed. Verbalized understanding related to d/c instructions. Signed belonging sheet in agreement with items received from locker. Ambulatory with a steady gait. Appears to be in no physical distress at time of departure.

## 2018-03-05 NOTE — Discharge Summary (Signed)
Physician Discharge Summary Note  Patient:  Johnny Vargas is an 22 y.o., male MRN:  [id number] DOB:  [birth year] Patient phone:  [phone] (home)  Patient address:   [address] [place] [address],  Total Time spent with patient: 30 minutes  Date of Admission:  02/28/2018 Date of Discharge: 03/05/2018  Reason for Admission:  Suicide attempt, depression  Principal Problem: MDD (major depressive disorder), recurrent severe, without psychosis (HCC) Discharge Diagnoses: Patient Active Problem List   Diagnosis Date Noted  . MDD (major depressive disorder), recurrent severe, without psychosis (HCC) [F33.2] 02/28/2018    Past Psychiatric History: see H&P  Past Medical History:  Past Medical History:  Diagnosis Date  . Anxiety    History reviewed. No pertinent surgical history. Family History: History reviewed. No pertinent family history. Family Psychiatric  History: see H&P Social History:  Social History   Substance and Sexual Activity  Alcohol Use Yes     Social History   Substance and Sexual Activity  Drug Use Yes  . Types: Marijuana    Social History   Socioeconomic History  . Marital status: Single    Spouse name: Not on file  . Number of children: Not on file  . Years of education: Not on file  . Highest education level: Not on file  Occupational History  . Not on file  Social Needs  . Financial resource strain: Not on file  . Food insecurity:    Worry: Not on file    Inability: Not on file  . Transportation needs:    Medical: Not on file    Non-medical: Not on file  Tobacco Use  . Smoking status: Current Every Day Smoker    Types: E-cigarettes  . Smokeless tobacco: Never Used  Substance and Sexual Activity  . Alcohol use: Yes  . Drug use: Yes    Types: Marijuana  . Sexual activity: Yes  Lifestyle  . Physical activity:    Days per week: Not on file    Minutes per session: Not on file  . Stress: Not on file  Relationships  . Social  connections:    Talks on phone: Not on file    Gets together: Not on file    Attends religious service: Not on file    Active member of club or organization: Not on file    Attends meetings of clubs or organizations: Not on file    Relationship status: Not on file  Other Topics Concern  . Not on file  Social History Narrative  . Not on file    Hospital Course:    This is an admission assessment for this 22 year old African-American male. Patient is admitted to the [facility] from the [facility] ED with complaints of worsening symptoms of depression & suicide attempt by overdose on Benadryl & Melatonin. Chart review indicates patient citing school related stress & financial problems as the trigger. He was transferred to the [facility] for more psychiatric evaluation & treatment. He was started on trial of lexapro and trazodone. He had some difficulty tolerating lexapro so he was changed to prozac. He had improvement of his presenting symptoms.  Today upon evaluation, pt shares, "I'm doing good." He denies any specific concerns. He is sleeping well. His appetite is good. He denies other physical complaints. He denies SI/HI/AH/VH. He is tolerating his medications well, and he is in agreement to continue his current regimen without changes. He plans to have follow up at [facility]. He  was able to engage in safety planning including plan to return to [facility] or contact emergency services if he feels unable to maintain his own safety or the safety of others. Pt had no further questions, comments, or concerns.   Physical Findings: AIMS: Facial and Oral Movements Muscles of Facial Expression: None, normal Lips and Perioral Area: None, normal Jaw: None, normal Tongue: None, normal,Extremity Movements Upper (arms, wrists, hands, fingers): None, normal Lower (legs, knees, ankles, toes): None, normal, Trunk Movements Neck, shoulders, hips: None, normal, Overall Severity Severity of abnormal  movements (highest score from questions above): None, normal Incapacitation due to abnormal movements: None, normal Patient's awareness of abnormal movements (rate only patient's report): No Awareness, Dental Status Current problems with teeth and/or dentures?: No Does patient usually wear dentures?: No  CIWA:  CIWA-Ar Total: 0 COWS:  COWS Total Score: 0  Musculoskeletal: Strength & Muscle Tone: within normal limits Gait & Station: normal Patient leans: N/A  Psychiatric Specialty Exam: Physical Exam  Nursing note and vitals reviewed.   Review of Systems  Constitutional: Negative for chills and fever.  Respiratory: Negative for cough and shortness of breath.   Cardiovascular: Negative for chest pain.  Gastrointestinal: Negative for abdominal pain, heartburn, nausea and vomiting.  Psychiatric/Behavioral: Negative for depression, hallucinations and suicidal ideas. The patient is not nervous/anxious and does not have insomnia.     Blood pressure 120/78, pulse 77, temperature 98.3 F (36.8 C), temperature source Oral, resp. rate 16, height 5\' 10"  (1.778 m), weight 65.3 kg.Body mass index is 20.66 kg/m.  General Appearance: Casual and Fairly Groomed  Eye Contact:  Good  Speech:  Clear and Coherent and Normal Rate  Volume:  Normal  Mood:  Euthymic  Affect:  Appropriate and Congruent  Thought Process:  Coherent and Goal Directed  Orientation:  Full (Time, Place, and Person)  Thought Content:  Logical  Suicidal Thoughts:  No  Homicidal Thoughts:  No  Memory:  Immediate;   Fair Recent;   Fair Remote;   Fair  Judgement:  Fair  Insight:  Fair  Psychomotor Activity:  Normal  Concentration:  Concentration: Fair  Recall:  [employer] of Knowledge:  Fair  Language:  Fair  Akathisia:  No  Handed:    AIMS (if indicated):     Assets:  Resilience Social Support  ADL's:  Intact  Cognition:  WNL  Sleep:  Number of Hours: 6.5     Have you used any form of tobacco in the last 30  days? (Cigarettes, Smokeless Tobacco, Cigars, and/or Pipes): Yes  Has this patient used any form of tobacco in the last 30 days? (Cigarettes, Smokeless Tobacco, Cigars, and/or Pipes) Yes, Yes, A prescription for an FDA-approved tobacco cessation medication was offered at discharge and the patient refused  Blood Alcohol level:  Lab Results  Component Value Date   ETH <10 02/27/2018    Metabolic Disorder Labs:  No results found for: HGBA1C, MPG No results found for: PROLACTIN No results found for: CHOL, TRIG, HDL, CHOLHDL, VLDL, LDLCALC  See Psychiatric Specialty Exam and Suicide Risk Assessment completed by Attending Physician prior to discharge.  Discharge destination:  Home  Is patient on multiple antipsychotic therapies at discharge:  No   Has Patient had three or more failed trials of antipsychotic monotherapy by history:  No  Recommended Plan for Multiple Antipsychotic Therapies: NA   Allergies as of 03/05/2018      Reactions   Penicillins       Medication List  STOP taking these medications   benzonatate 100 MG capsule Commonly known as:  TESSALON   fluticasone 50 MCG/ACT nasal spray Commonly known as:  FLONASE   ipratropium 0.06 % nasal spray Commonly known as:  ATROVENT     TAKE these medications     Indication  FLUoxetine 10 MG capsule Commonly known as:  PROZAC Take 1 capsule (10 mg total) by mouth daily. Start taking on:  03/06/2018  Indication:  Depression   traZODone 50 MG tablet Commonly known as:  DESYREL Take 1 tablet (50 mg total) by mouth at bedtime as needed for sleep.  Indication:  Trouble Sleeping      Follow-up Information    [facility] on 03/16/2018.   Why:  Medication management appt with Crystal on Friday, 11/1 at 2:00PM. Please arrive 15 minutes early and bring insurance card. Therapy appt with Ronalee Belts on Thursday, 11/14 at 2:00PM. Thank you.  Contact information: [address] [place] [address] [phone]           Follow-up recommendations:  Activity:  as tolerated Diet:  normal Tests:  NA Other:  see above for DC plan  Comments:    Signed: Micheal Likens, MD 03/05/2018, 10:50 AM

## 2019-08-21 ENCOUNTER — Ambulatory Visit
Admission: EM | Admit: 2019-08-21 | Discharge: 2019-08-21 | Disposition: A | Discharge status: Home / Self Care | Payer: BC Managed Care – PPO | Attending: Physician Assistant | Admitting: Physician Assistant

## 2019-08-21 ENCOUNTER — Other Ambulatory Visit: Payer: Self-pay

## 2019-08-21 ENCOUNTER — Ambulatory Visit (HOSPITAL_COMMUNITY)
Admission: EM | Admit: 2019-08-21 | Discharge: 2019-08-21 | Discharge status: Against Medical Advice | Payer: BC Managed Care – PPO

## 2019-08-21 DIAGNOSIS — L600 Ingrowing nail: Secondary | ICD-10-CM | POA: Diagnosis not present

## 2019-08-21 MED ORDER — MUPIROCIN 2 % EX OINT: 1.0000 "application " | TOPICAL_OINTMENT | Freq: Two times a day (BID) | CUTANEOUS | 0 refills | Status: AC

## 2019-08-21 NOTE — Discharge Instructions (Signed)
You can remove current dressing in 24 hours. Dress with bactroban for the next 3 days. Otherwise, keep wound clean and dry with soap and water. Avoid tight shoes. Can follow up with podiatry for monitoring and preventing repeat episodes. If noticing signs of infection including spreading redness, warmth, follow up for reevaluation needed.

## 2019-08-21 NOTE — ED Provider Notes (Signed)
[facility]    CSN: [id number] Arrival date & time: 08/21/19  1518      History   Chief Complaint Chief Complaint  Patient presents with  . Toe Pain    HPI Johnny Vargas is a 24 y.o. male.   24 year old male comes in for ingrown toe nail to the right great toe. States this morning had pain, and noticed drainage. Denies erythema, warmth, swelling. Denies new injury/trauma. States had known he had ingrown nail, but not much pain till today. Due to drainage, came in for evaluation.      Past Medical History:  Diagnosis Date  . Anxiety     Patient Active Problem List   Diagnosis Date Noted  . MDD (major depressive disorder), recurrent severe, without psychosis ([place]) 02/28/2018    History reviewed. No pertinent surgical history.     Home Medications    Prior to Admission medications   Medication Sig Start Date End Date Taking? Authorizing Provider  mupirocin ointment (BACTROBAN) 2 % Apply 1 application topically 2 (two) times daily. 08/21/19   Ok Edwards, PA-C    Family History History reviewed. No pertinent family history.  Social History Social History   Tobacco Use  . Smoking status: Never Smoker  . Smokeless tobacco: Never Used  Substance Use Topics  . Alcohol use: Not Currently  . Drug use: Not Currently     Allergies   Penicillins   Review of Systems Review of Systems  Reason unable to perform ROS: See HPI as above.     Physical Exam Triage Vital Signs ED Triage Vitals  Enc Vitals Group     BP 08/21/19 1628 120/83     Pulse Rate 08/21/19 1628 64     Resp 08/21/19 1628 16     Temp 08/21/19 1628 98.4 F (36.9 C)     Temp Source 08/21/19 1628 Oral     SpO2 08/21/19 1628 97 %     Weight --      Height --      Head Circumference --      Peak Flow --      Pain Score 08/21/19 1656 2     Pain Loc --      Pain Edu? --      Excl. in [place]? --    No data found.  Updated Vital Signs BP 120/83 (BP Location: Right Arm)   Pulse 64    Temp 98.4 F (36.9 C) (Oral)   Resp 16   SpO2 97%   Visual Acuity Right Eye Distance:   Left Eye Distance:   Bilateral Distance:    Right Eye Near:   Left Eye Near:    Bilateral Near:     Physical Exam Constitutional:      General: He is not in acute distress.    Appearance: Normal appearance. He is well-developed. He is not toxic-appearing or diaphoretic.  HENT:     Head: Normocephalic and atraumatic.  Eyes:     Conjunctiva/sclera: Conjunctivae normal.     Pupils: Pupils are equal, round, and reactive to light.  Pulmonary:     Effort: Pulmonary effort is normal. No respiratory distress.     Comments: Speaking in full sentences without difficulty Musculoskeletal:     Cervical back: Normal range of motion and neck supple.     Comments: Right great toe lateral ingrown nail. No paronychia noted. No erythema, warmth. No drainage. Tenderness to palpation along lateral nail. Full ROM of  the toe. NVI  Skin:    General: Skin is warm and dry.  Neurological:     Mental Status: He is alert and oriented to person, place, and time.      UC Treatments / Results  Labs (all labs ordered are listed, but only abnormal results are displayed) Labs Reviewed - No data to display  EKG   Radiology No results found.  Procedures Excise Ingrown Toenail  Date/Time: 08/21/2019 7:31 PM Performed by: Belinda Fisher, PA-C Authorized by: Belinda Fisher, PA-C   Consent:    Consent obtained:  Verbal   Consent given by:  Patient   Risks discussed:  Bleeding, incomplete removal, infection, pain and permanent nail deformity   Alternatives discussed:  Alternative treatment and referral Location:    Foot:  R big toe Pre-procedure details:    Skin preparation:  ChloraPrep and Hibiclens Anesthesia (see MAR for exact dosages):    Anesthesia method:  Nerve block   Block location:  Right great toe   Block needle gauge:  27 G   Block anesthetic:  Lidocaine 1% w/o epi   Block injection procedure:   Anatomic landmarks identified, introduced needle, incremental injection, negative aspiration for blood and anatomic landmarks palpated   Block outcome:  Anesthesia achieved Nail Removal:    Nail removed:  Partial   Nail side:  Lateral   Nail bed repaired: no     Removed nail replaced and anchored: no   Trephination:    Subungual hematoma drained: no   Ingrown nail:    Wedge excision of skin: yes     Nail matrix removed or ablated:  None Nails trimmed:    Number of nails trimmed:  1 Post-procedure details:    Dressing:  Antibiotic ointment and 4x4 sterile gauze   Patient tolerance of procedure:  Tolerated well, no immediate complications   (including critical care time)  Medications Ordered in UC Medications - No data to display  Initial Impression / Assessment and Plan / UC Course  I have reviewed the triage vital signs and the nursing notes.  Pertinent labs & imaging results that were available during my care of the patient were reviewed by me and considered in my medical decision making (see chart for details).    Discussed warm compress/monitoring vs toe nail removal. Patient would like to proceed with toe nail removal.   Patient tolerated procedure well.  Wound care instructions discussed.  Return precautions given.  Patient expresses understanding and agrees to plan.  Final Clinical Impressions(s) / UC Diagnoses   Final diagnoses:  Ingrown nail of great toe of right foot    ED Prescriptions    Medication Sig Dispense Auth. Provider   mupirocin ointment (BACTROBAN) 2 % Apply 1 application topically 2 (two) times daily. 22 g Belinda Fisher, PA-C     PDMP not reviewed this encounter.   Belinda Fisher, PA-C 08/21/19 1932

## 2019-08-21 NOTE — ED Triage Notes (Signed)
Pt c/o ingrown toe nail to rt great toe. State he just noticed it this morning when it was hurting and had puss coming out.

## 2019-10-03 IMAGING — CT CT HEAD W/O CM
4 series · 17 of 47 positions shown, 19 images · non-contrast
Comparison: None.

CLINICAL DATA: Seizure

EXAM:
CT HEAD WITHOUT CONTRAST
TECHNIQUE: Contiguous axial images were obtained from the base of the skull
through the vertex without intravenous contrast.

[Series 3: head without · axial · non-contrast · 0.46mm/px · z∈[-171,-41]mm · 7 of 36 slices shown, 9 images]
[im 5/36  brain]
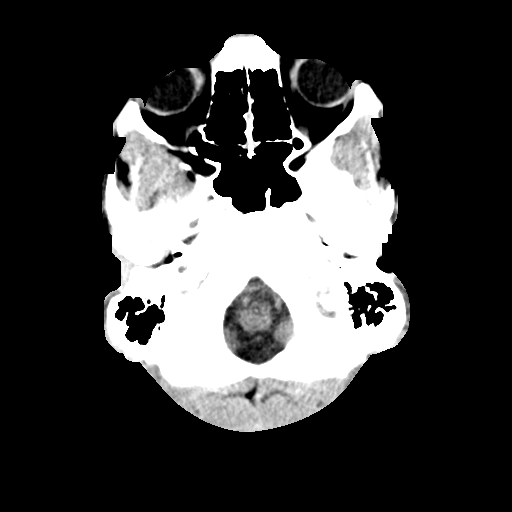
[im 5/36  bone]
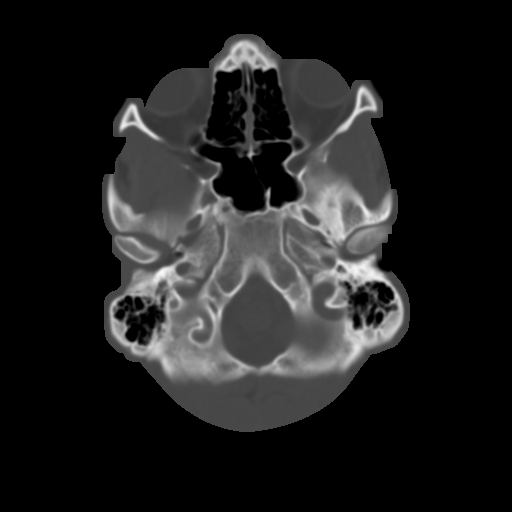
[im 9/36  brain]
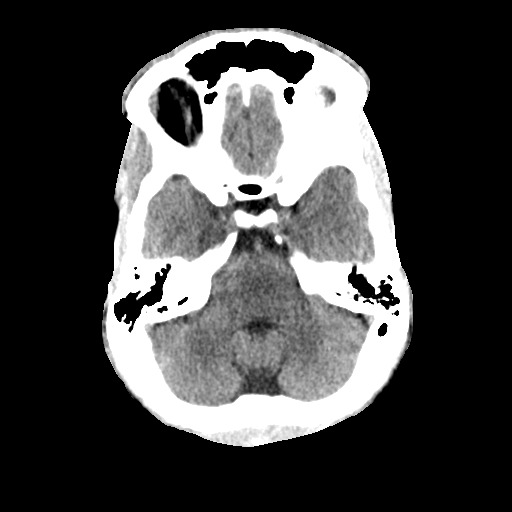
[im 14/36  brain]
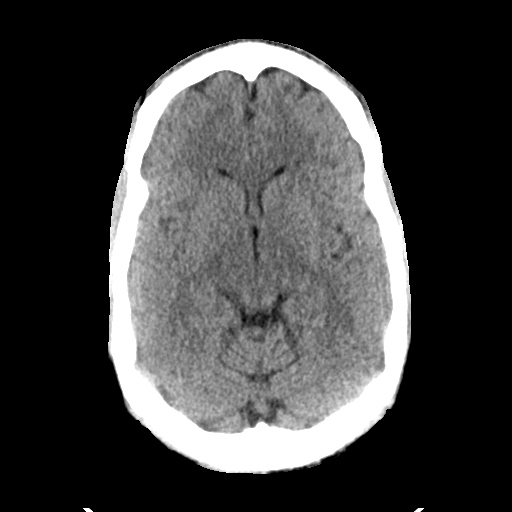
[im 18/36  brain]
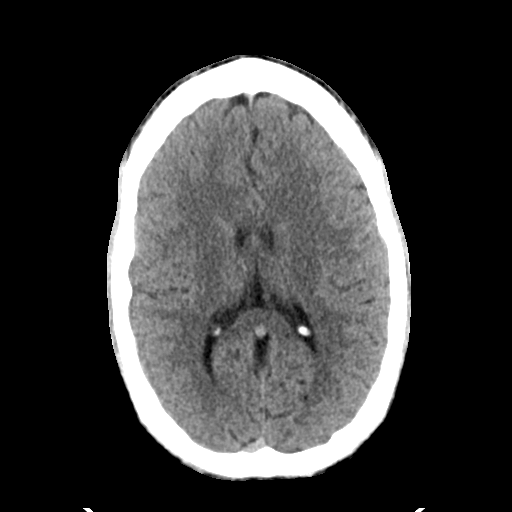
[im 22/36  brain]
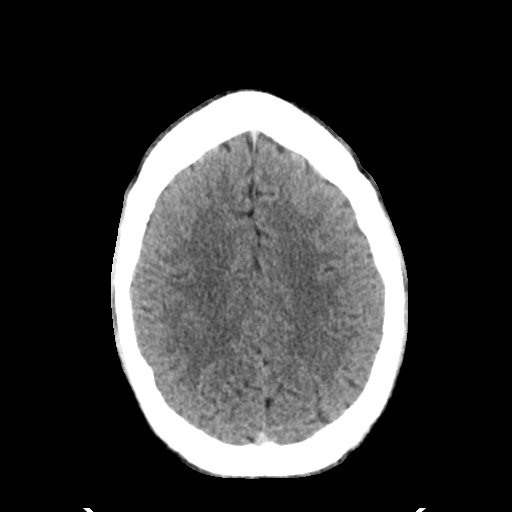
[im 22/36  bone]
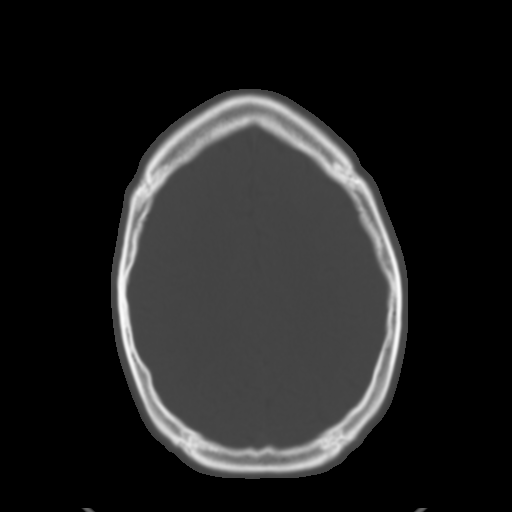
[im 27/36  brain]
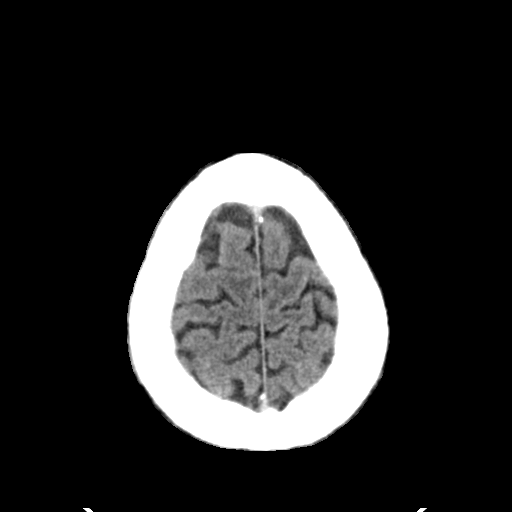
[im 31/36  brain]
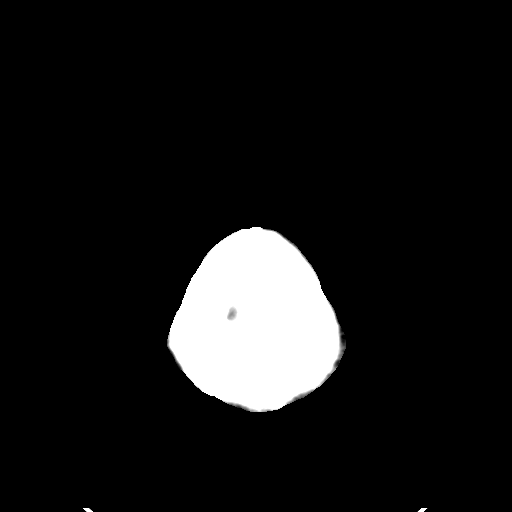

[Series 4: head bone · axial · 0.46mm/px · z∈[-175,-111]mm · 4 of 90 slices shown]
[im 9/90  bone]
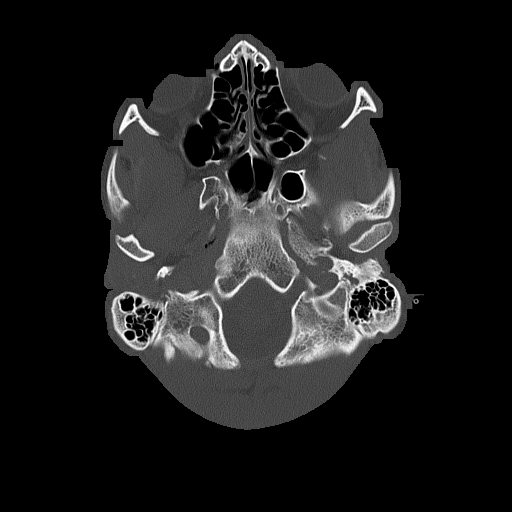
[im 18/90  bone]
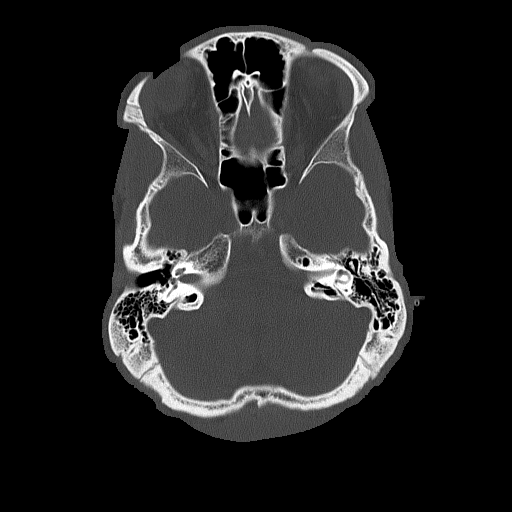
[im 27/90  bone]
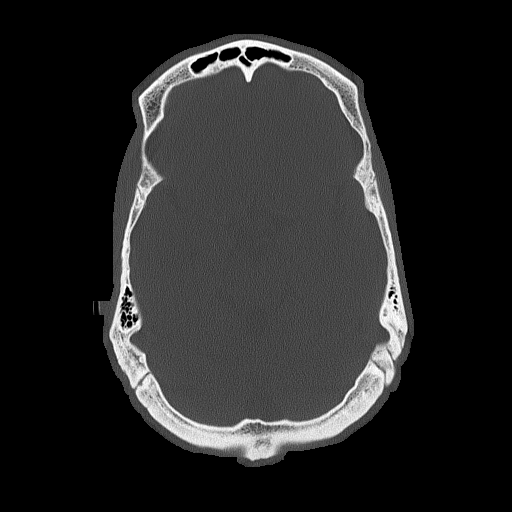
[im 41/90  bone]
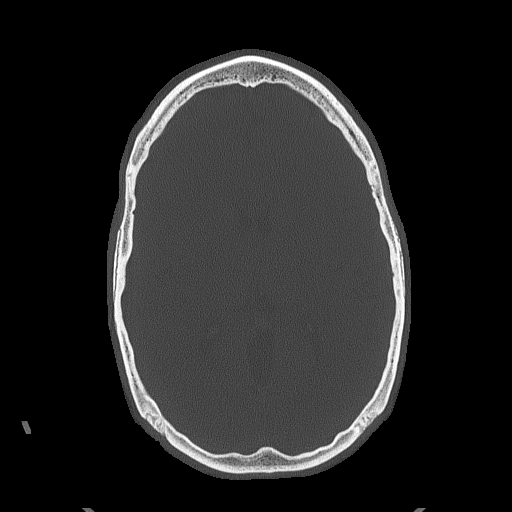

[Series 5: head without cor · coronal · non-contrast · 0.35mm/px · 3 of 76 slices shown]
[im 26/76  brain]
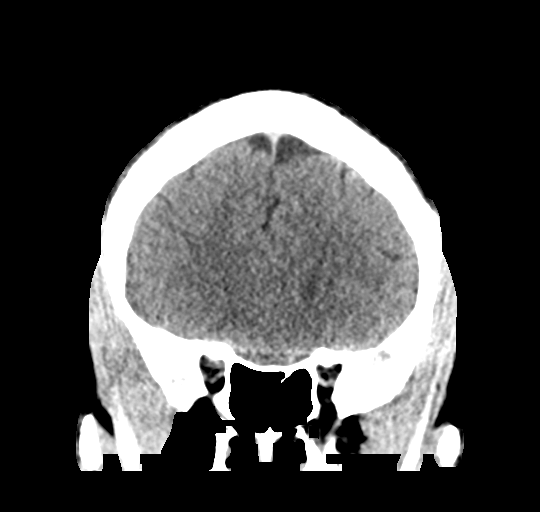
[im 34/76  brain]
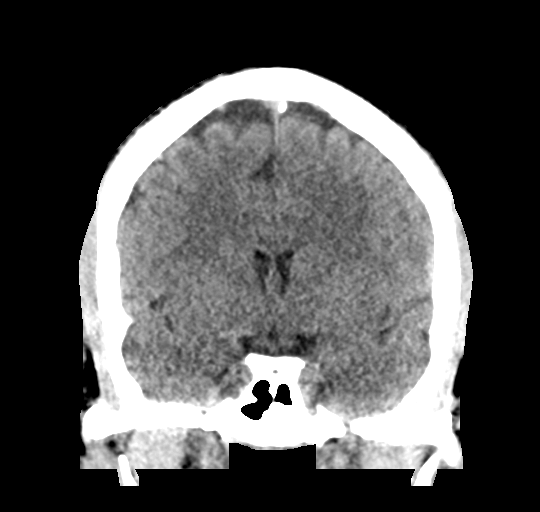
[im 42/76  brain]
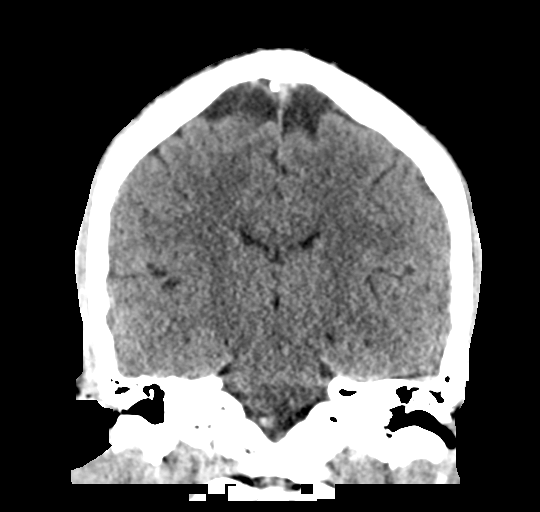

[Series 6: head without sag · sagittal · non-contrast · 0.39mm/px · 3 of 66 slices shown]
[im 22/66  brain]
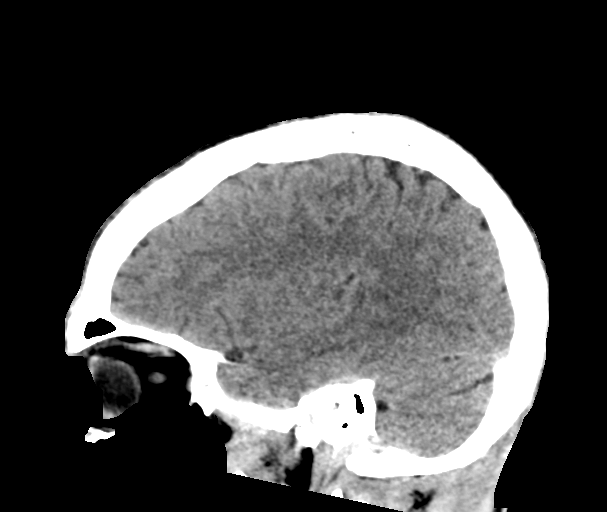
[im 33/66  brain]
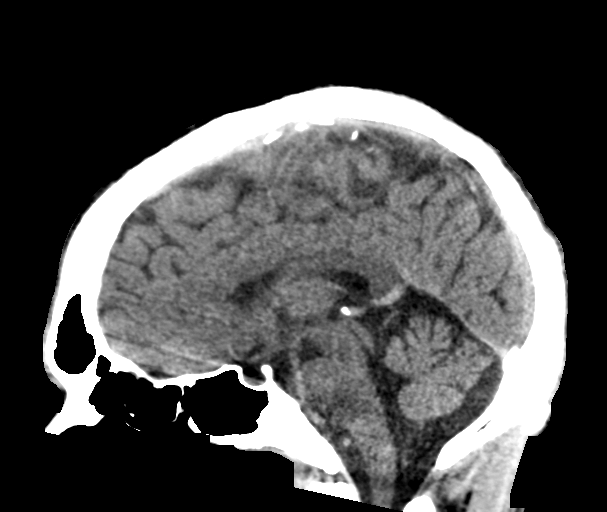
[im 44/66  brain]
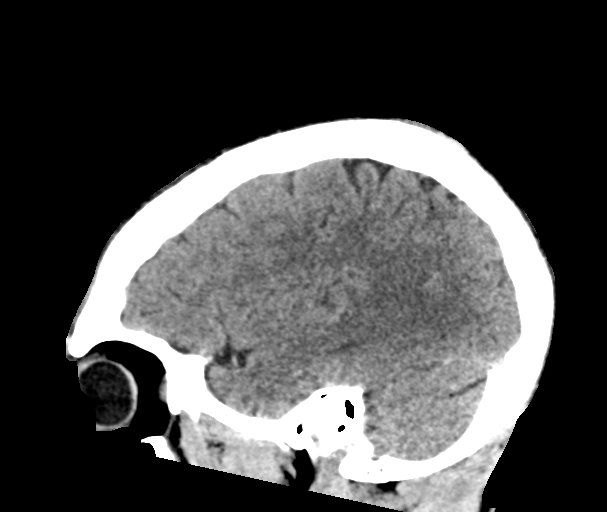

[17 of 47 positions shown; findings below may reference images not displayed]

FINDINGS: Brain: No evidence of acute infarction, hemorrhage, hydrocephalus,
extra-axial collection or mass lesion/mass effect.

Vascular: No hyperdense vessel or unexpected calcification.

Skull: Normal. Negative for fracture or focal lesion.

Sinuses/Orbits: No acute finding.

Other: None
IMPRESSION: Negative non contrasted CT appearance of the brain
